# Patient Record
Sex: Female | Born: 1985
Health system: Southern US, Community
[De-identification: ages and names within clinical notes are randomized; demographics above are authoritative.]

## PROBLEM LIST (undated history)

## (undated) DIAGNOSIS — J45909 Unspecified asthma, uncomplicated: Secondary | ICD-10-CM

---
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
## 2005-06-22 ENCOUNTER — Ambulatory Visit: Payer: Self-pay | Admitting: Nurse Practitioner

## 2005-08-05 ENCOUNTER — Ambulatory Visit: Payer: Self-pay | Admitting: Nurse Practitioner

## 2008-05-15 ENCOUNTER — Emergency Department (HOSPITAL_COMMUNITY): Admission: EM | Admit: 2008-05-15 | Discharge: 2008-05-15 | Payer: Self-pay | Admitting: Family Medicine

## 2008-06-30 ENCOUNTER — Inpatient Hospital Stay (HOSPITAL_COMMUNITY): Admission: AD | Admit: 2008-06-30 | Discharge: 2008-06-30 | Payer: Self-pay | Admitting: Obstetrics & Gynecology

## 2008-07-03 ENCOUNTER — Emergency Department (HOSPITAL_COMMUNITY): Admission: EM | Admit: 2008-07-03 | Discharge: 2008-07-03 | Payer: Self-pay | Admitting: Emergency Medicine

## 2008-08-29 ENCOUNTER — Inpatient Hospital Stay (HOSPITAL_COMMUNITY): Admission: RE | Admit: 2008-08-29 | Discharge: 2008-09-05 | Payer: Self-pay | Admitting: Obstetrics & Gynecology

## 2008-08-29 ENCOUNTER — Ambulatory Visit: Payer: Self-pay | Admitting: Obstetrics & Gynecology

## 2008-08-29 ENCOUNTER — Ambulatory Visit: Payer: Self-pay | Admitting: Obstetrics and Gynecology

## 2008-09-02 ENCOUNTER — Encounter: Payer: Self-pay | Admitting: Obstetrics and Gynecology

## 2010-04-08 IMAGING — US US OB DETAIL+14 WK
1 series · 14 of 28 positions shown · non-contrast
Comparison: none

OBSTETRICAL ULTRASOUND:
 This ultrasound exam was performed in the [HOSPITAL] Ultrasound Department.  The OB US report was generated in the AS system, and faxed to the ordering physician.  This report is also available in [REDACTED] PACS.

[Series 1: us ob comp +14 wk · 14 of 65 slices shown]
[im 3/65]
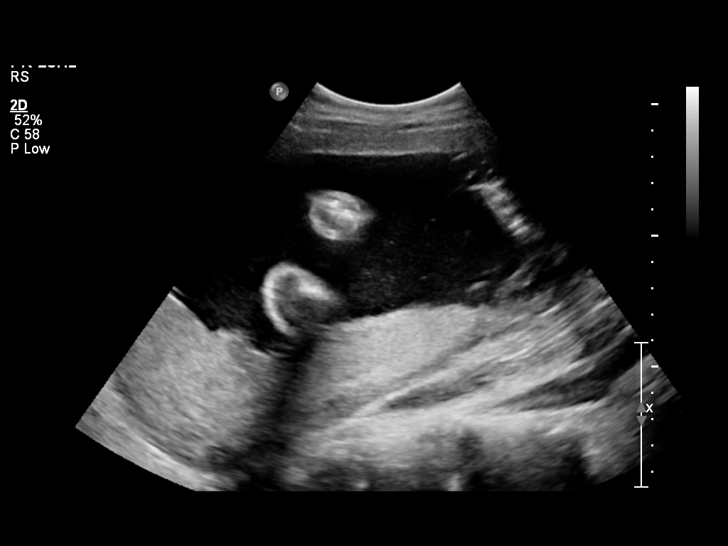
[im 8/65]
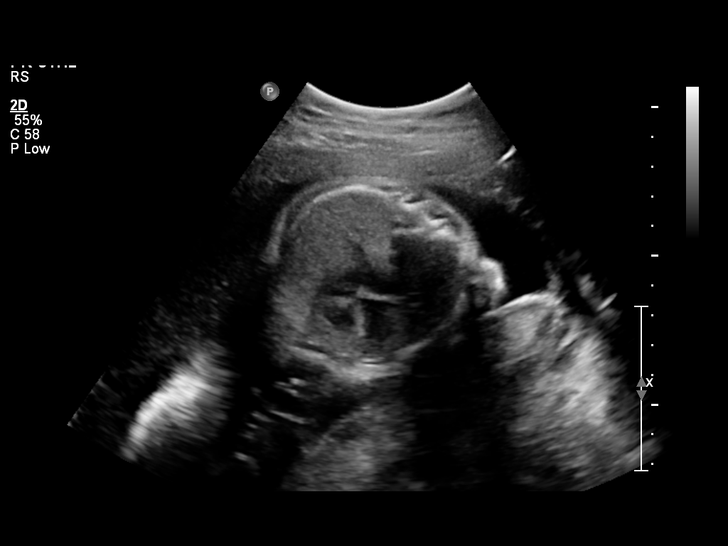
[im 12/65]
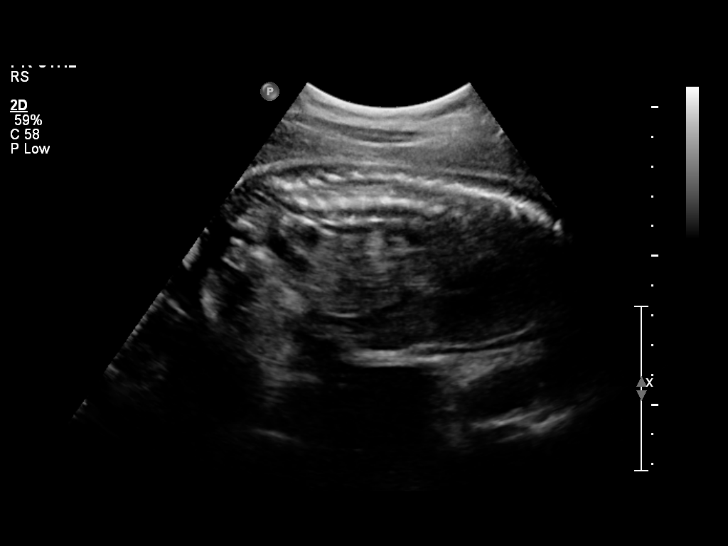
[im 17/65]
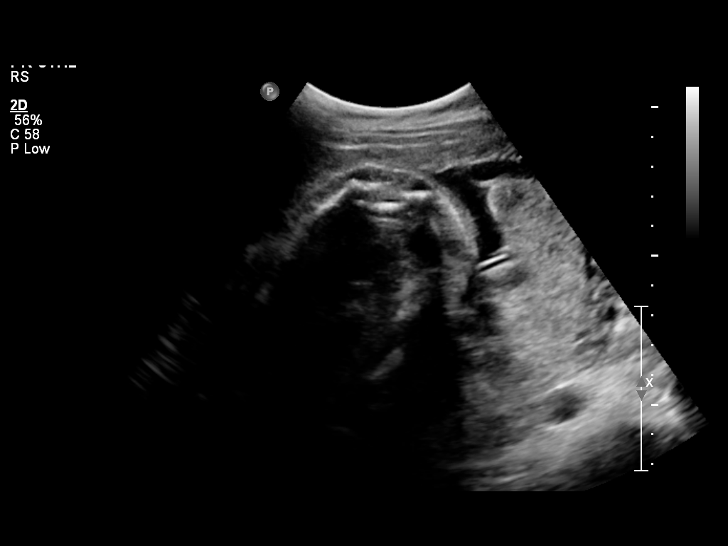
[im 22/65]
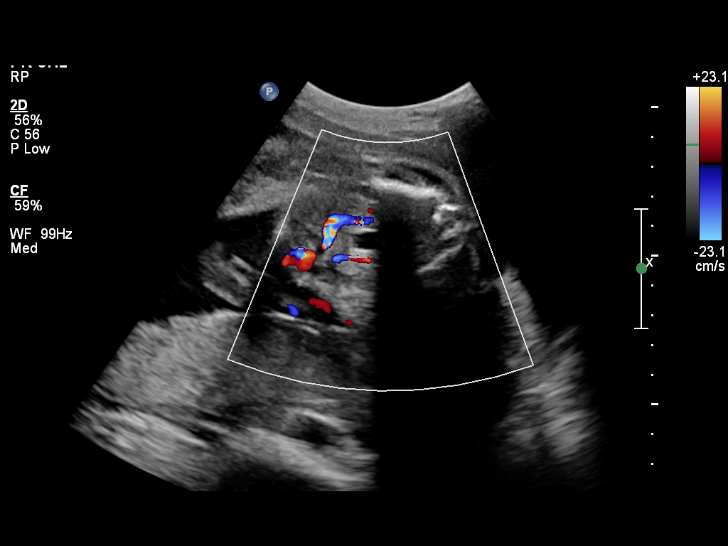
[im 27/65]
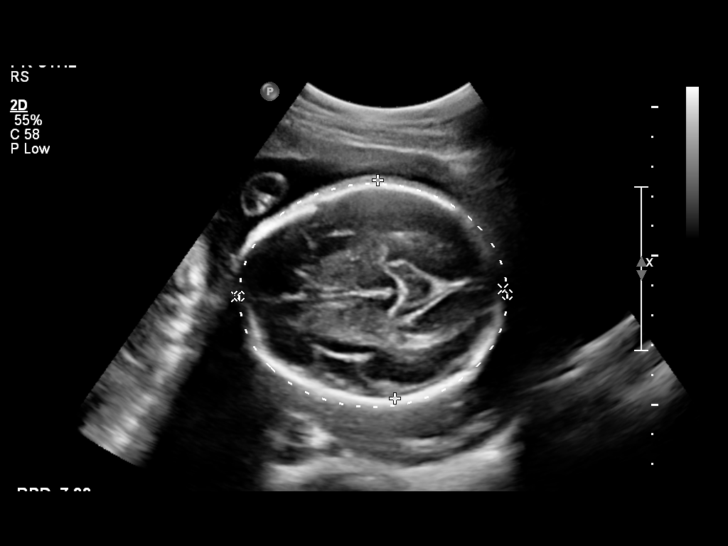
[im 31/65]
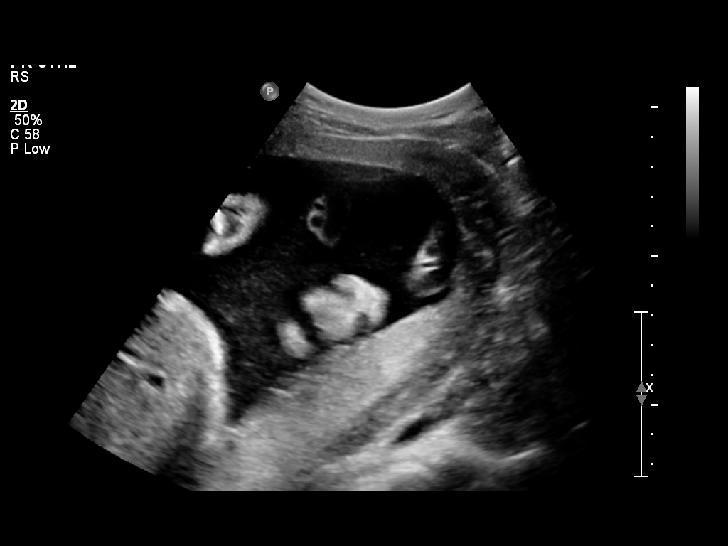
[im 36/65]
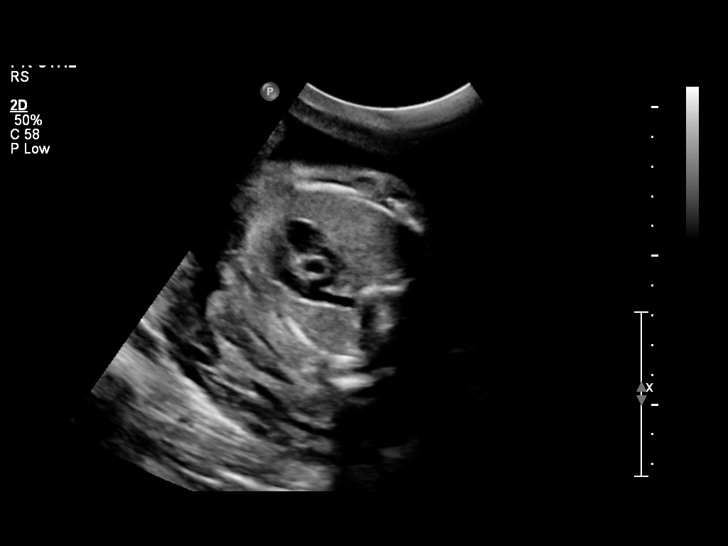
[im 41/65]
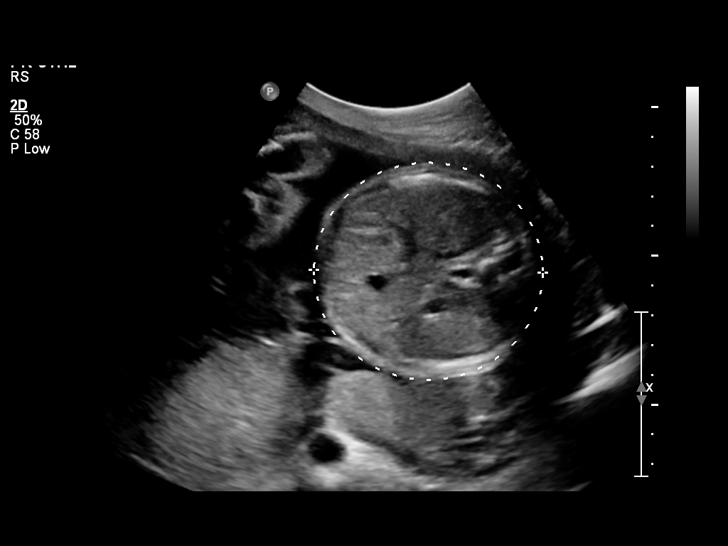
[im 46/65]
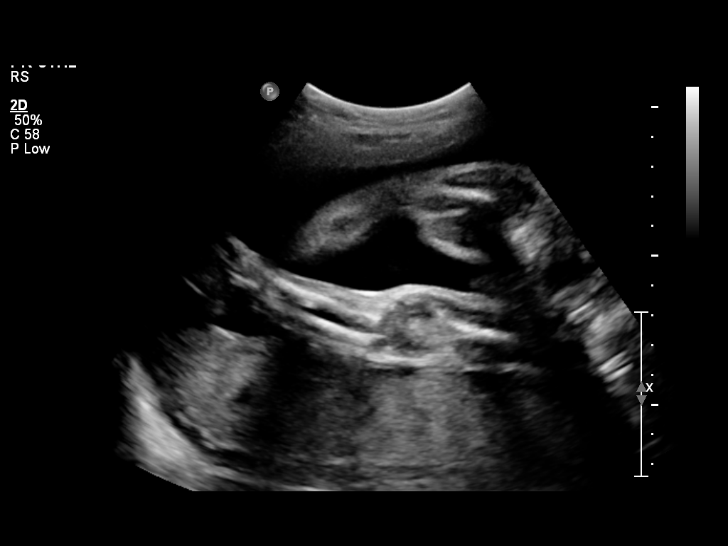
[im 50/65]
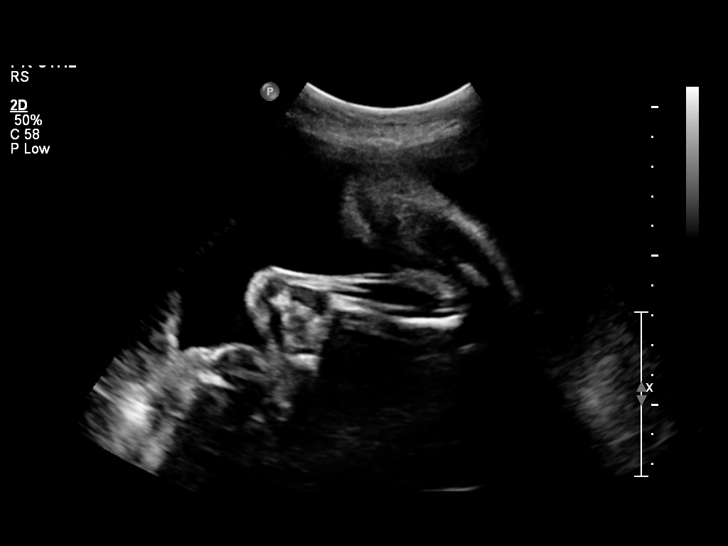
[im 55/65]
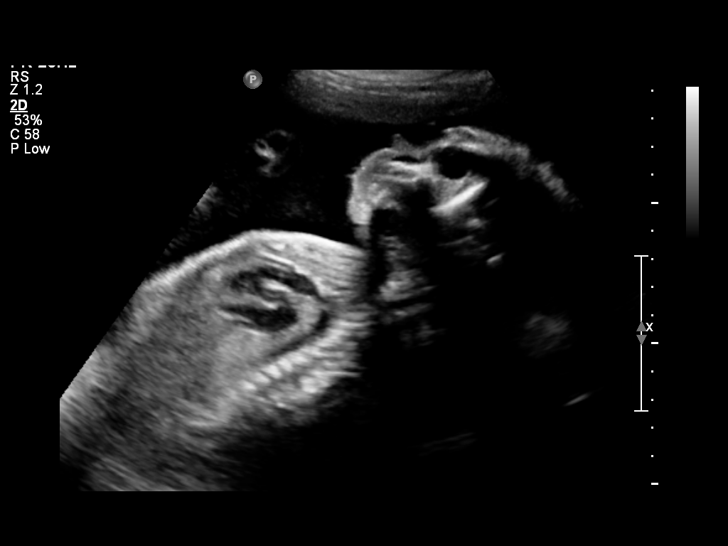
[im 60/65]
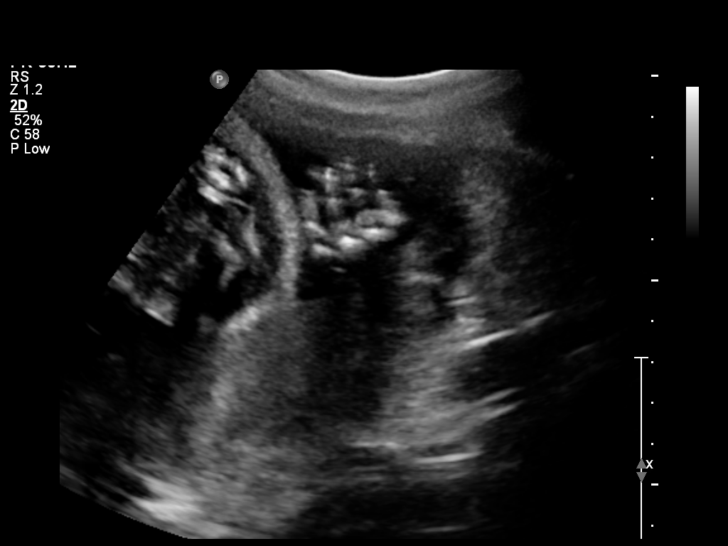
[im 65/65]
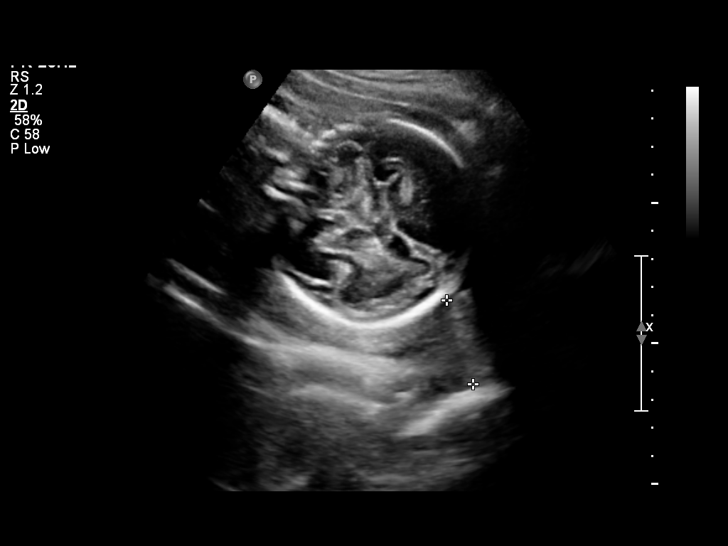

[14 of 28 positions shown; findings below may reference images not displayed]

IMPRESSION: See AS Obstetric US report.

## 2010-07-21 LAB — CBC
HCT: 30.5 % — ABNORMAL LOW (ref 36.0–46.0)
HCT: 37.9 % (ref 36.0–46.0)
Hemoglobin: 12.8 g/dL (ref 12.0–15.0)
MCHC: 34.8 g/dL (ref 30.0–36.0)
MCHC: 35.1 g/dL (ref 30.0–36.0)
MCV: 95.4 fL (ref 78.0–100.0)
MCV: 97.1 fL (ref 78.0–100.0)
Platelets: 183 10*3/uL (ref 150–400)
RBC: 3.81 MIL/uL — ABNORMAL LOW (ref 3.87–5.11)
RBC: 3.97 MIL/uL (ref 3.87–5.11)
RDW: 14 % (ref 11.5–15.5)
WBC: 12.3 10*3/uL — ABNORMAL HIGH (ref 4.0–10.5)
WBC: 8.8 10*3/uL (ref 4.0–10.5)

## 2010-07-21 LAB — COMPREHENSIVE METABOLIC PANEL
ALT: 22 U/L (ref 0–35)
AST: 29 U/L (ref 0–37)
Alkaline Phosphatase: 160 U/L — ABNORMAL HIGH (ref 39–117)
CO2: 25 mEq/L (ref 19–32)
GFR calc Af Amer: >60 mL/min (ref >60)
Glucose, Bld: 83 mg/dL (ref 70–99)
Potassium: 3.1 mEq/L — ABNORMAL LOW (ref 3.5–5.1)
Sodium: 139 mEq/L (ref 135–145)
Total Protein: 6.1 g/dL (ref 6.0–8.3)

## 2010-07-21 LAB — BASIC METABOLIC PANEL
BUN: 4 mg/dL — ABNORMAL LOW (ref 6–23)
Calcium: 7.2 mg/dL — ABNORMAL LOW (ref 8.4–10.5)
Chloride: 102 mEq/L (ref 96–112)
Creatinine, Ser: 0.6 mg/dL (ref 0.4–1.2)
GFR calc Af Amer: >60 mL/min (ref >60)
GFR calc non Af Amer: >60 mL/min (ref >60)

## 2010-07-21 LAB — LACTATE DEHYDROGENASE: LDH: 181 U/L (ref 94–250)

## 2010-07-21 LAB — PROTEIN, URINE, 24 HOUR
Collection Interval-UPROT: 24 hours
Urine Total Volume-UPROT: 1500 mL

## 2010-07-21 LAB — CREATININE CLEARANCE, URINE, 24 HOUR
Creatinine Clearance: 69 mL/min — ABNORMAL LOW (ref 75–115)
Creatinine, 24H Ur: 537 mg/d — ABNORMAL LOW (ref 700–1800)
Creatinine: 0.54 mg/dL (ref 0.40–1.20)

## 2010-07-23 LAB — URINALYSIS, ROUTINE W REFLEX MICROSCOPIC
Ketones, ur: NEGATIVE mg/dL
Nitrite: NEGATIVE
Protein, ur: NEGATIVE mg/dL
Urobilinogen, UA: 0.2 mg/dL (ref 0.0–1.0)

## 2010-07-23 LAB — TYPE AND SCREEN
ABO/RH(D): O POS
Antibody Screen: POSITIVE

## 2010-07-23 LAB — COMPREHENSIVE METABOLIC PANEL
AST: 30 U/L (ref 0–37)
CO2: 24 mEq/L (ref 19–32)
Calcium: 9 mg/dL (ref 8.4–10.5)
Creatinine, Ser: 0.48 mg/dL (ref 0.4–1.2)
GFR calc Af Amer: >60 mL/min (ref >60)
GFR calc non Af Amer: >60 mL/min (ref >60)
Sodium: 135 mEq/L (ref 135–145)
Total Protein: 6.2 g/dL (ref 6.0–8.3)

## 2010-07-23 LAB — URIC ACID: Uric Acid, Serum: 3.5 mg/dL (ref 2.4–7.0)

## 2010-07-23 LAB — DIFFERENTIAL
Eosinophils Relative: 1 % (ref 0–5)
Lymphocytes Relative: 24 % (ref 12–46)
Lymphs Abs: 1.6 10*3/uL (ref 0.7–4.0)
Monocytes Relative: 10 % (ref 3–12)
Neutrophils Relative %: 66 % (ref 43–77)

## 2010-07-23 LAB — CBC
MCHC: 34 g/dL (ref 30.0–36.0)
MCV: 96.5 fL (ref 78.0–100.0)
RBC: 3.62 MIL/uL — ABNORMAL LOW (ref 3.87–5.11)
RDW: 13.6 % (ref 11.5–15.5)

## 2010-07-23 LAB — RPR: RPR Ser Ql: NONREACTIVE

## 2010-07-23 LAB — HEPATITIS B SURFACE ANTIGEN: Hepatitis B Surface Ag: NEGATIVE

## 2010-07-28 LAB — POCT URINALYSIS DIP (DEVICE)
Protein, ur: 30 mg/dL — AB
Urobilinogen, UA: 2 mg/dL — ABNORMAL HIGH (ref 0.0–1.0)

## 2010-08-25 NOTE — Op Note (Signed)
NAME:  [name], [name] NO.:  [id number]   MEDICAL RECORD NO.:  [id number]         PATIENT TYPE:  WINP   LOCATION:                                FACILITY:  [facility]   PHYSICIAN:  Tilda Burrow, M.D. DATE OF BIRTH:  08/28/1985   DATE OF PROCEDURE:  DATE OF DISCHARGE:                               OPERATIVE REPORT   PREOPERATIVE DIAGNOSIS:  Pregnancy 37 weeks 1 day, severe preeclampsia,  nonreassuring fetal heart tones.   POSTOPERATIVE DIAGNOSIS:  Pregnancy 37 weeks 1 day, severe reeclampsia,  nonreassuring fetal heart tones secondary to nuchal cord x1.   PROCEDURE:  Primary low transverse cervical cesarean section.   SURGEON:  Tilda Burrow, MD.   ASSISTANT:  None.   ANESTHESIA:  Epidural.   COMPLICATIONS:  None.   FINDINGS:  5 pounds 13 ounces female infant, Apgars 6 and 8, pH 7.1 7,  placenta sent for pathology, findings nuchal cord x1 very moulded vertex  with recent rapid descent.   INDICATIONS:  A 25 year old female with induction of labor due to  preeclampsia with blood pressures in the 155-160/100 diastolic range.  The patient had presented breech, had successful external versions on  the morning of 08/31/2008 without decelerations and was begun induction  afterwards.  Cervix was unfavorable, requiring Cytotec dosing x6.  The  patient was unable to tolerate it.  Placed on a Foley bulb.  She began  to progress in the afternoon on 05/23, was 3 cm at 1925,  3 cm, 75% to  80%, -1 at 2230.  She was 4 cm at 2:30 a.m.  She began to make rapid  progress with presenting 45, 80+1 at 4:50 a.m., descending rapidly and  dramatically but accompanied by severe bradycardia in the 70s with  intermittent recovery necessitating intervention by cesarean.   DETAILS OF THE PROCEDURE:  The patient was taken to the operating room,  prepped and draped for lower abdominal surgery.  Fetal heart rate was  130 upon arrival to the ward, but promptly returned into the 70s.   Since  the epidural was effective, a transverse lower abdominal uterine  incision was performed.  Time-out had been conducted and agreed to  perform an IV antibiotics administrated.  Pfannenstiel-type incision was  performed with a transverse uterine incision performed and clear  amniotic fluid without malodour and care on nuchal cord x1, barged  immediately for the incision.  The fetal vertex was rotated from its  left occiput transverse position into the incision and the baby expelled  easily with fundal pressure.  Cord was clamped and the baby placed in  the care of Dr. Francine Graven who was in attendance.  Cord blood gas was  obtained, pH returned 7.17.  Cord was sent.  Routine cord samples  obtained and placenta delivered by Crede massage.  The uterus was closed  with running, locking, single first layer followed by an oversewing  second layer of continuous running 0 chromic.  In addition, a figure-of-  eight suture was required on the anterior right side of the uterine  incision.  Hemostasis was obtained as adequate.  A  2-0 chromic bladder  flap closure was followed.  Inspection for continued oozing revealed  subfascial blood vessel that responded to point cautery, completing  adequate hemostasis.  Blood was removed from the abdomen, the anterior  peritoneum closed with 2-0 chromic.  The fascia closed with running 0  Vicryl, subcu tissues reapproximated in the midline x1 with chromic  suture, and then subcuticular 3-0 Vicryl closure of the skin completed.  Steri-Strips were applied and fresh dressing placed.  The patient went  to recovery room in stable condition with blood pressures upon arrival  in recovery room of 117/68 with pulse in the 70s, EBL is 600 mL.  Fluids  received intraop 800, urine 200 mL clear.      Tilda Burrow, M.D.  Electronically Signed     JVF/MEDQ  D:  09/02/2008  T:  09/03/2008  Job:  [id number]

## 2010-08-25 NOTE — Op Note (Signed)
[name], [name]            ACCOUNT NO.:  [id number]   MEDICAL RECORD NO.:  [id number]           PATIENT TYPE:   LOCATION:                                 FACILITY:   PHYSICIAN:  Norton Blizzard, MD    DATE OF BIRTH:  12-14-1985   DATE OF PROCEDURE:  09/03/2008  DATE OF DISCHARGE:                               OPERATIVE REPORT   PREOPERATIVE DIAGNOSIS:  Undesired fertility.   POSTOPERATIVE DIAGNOSIS:  Undesired fertility.   PROCEDURE:  Postpartum bilateral tubal sterilization procedure with  Filshie clips.   SURGEON:  Norton Blizzard, MD   ASSISTANT:  Odie Sera, DO   ANESTHESIA:  General and local.   INDICATIONS FOR PROCEDURE:  Ms. [name] is a 25 year old female  who is status post delivery of an infant 1 day ago.  She has previously  requested and been counseled for an elective tubal sterilization with  the risks to include but not limited to bleeding, infection, and damage  to internal organs as well as the failure rate being approximately 1-2  per 200 with an increased risk of ectopic gestation should failure  occur.  The patient and her mother voiced understanding of these risks  and desired to proceed with the surgery.   PROCEDURE DETAILS:  The patient was taken to the operating room where  general anesthesia was introduced.  She was then prepped and draped in  the usual sterile manner.  A time-out was conducted.  A 2-cm transverse  incision was made in the skin at the inferior aspect of the umbilicus,  extended through subcutaneous layers down to the fascia.  The fascia was  then incised in the midline with Mayo scissors, and the fascial incision  was extended laterally.  The peritoneum was entered bluntly.  The left  fallopian tube was identified and grasped with a Babcock clamp and  traced distally to the fimbriated end.  It was then traced proximally,  and a Filshie clip was placed approximately 2 cm from the cornua of the  uterus.  A 0.3 mL of  0.25% Marcaine were injected into the fallopian  tube.  The tube was then returned to the abdomen.  The right fallopian  tube was identified and grasped with a Babcock clamp.  It was then  traced distally to identify the fimbriated end and traced proximally to  approximately 2 cm from the cornu of the uterus where an another Filshie  clip was placed.  The tube was then injected with 0.3 mL of 0.25%  Marcaine.  The tube was then returned to the abdomen.  The fascia was  then closed using 0 Vicryl in a running noninterlocking manner.  The  skin was closed using 4-0 Vicryl in a subcuticular manner.  The incision  was then injected with 10 mL of 0.25% Marcaine.  All sponge, needle, and  instrument counts were correct x2.   FINDINGS:  Bilateral tubes and ovaries grossly normal.   SPECIMENS:  None.   ESTIMATED BLOOD LOSS:  Minimal.   There were no immediate complications, and the patient was taken to the  PACU in good condition.      Odie Sera, DO  Electronically Signed     ______________________________  Norton Blizzard, MD    MC/MEDQ  D:  09/03/2008  T:  09/04/2008  Job:  [id number]

## 2010-08-25 NOTE — Op Note (Signed)
NAMEONEKA, PARADA            ACCOUNT NO.:  [id number]   MEDICAL RECORD NO.:  [id number]          PATIENT TYPE:  INP   LOCATION:  9103                          FACILITY:  [facility]   PHYSICIAN:  Tilda Burrow, M.D. DATE OF BIRTH:  [birth year]   DATE OF PROCEDURE:  DATE OF DISCHARGE:  09/05/2008                               OPERATIVE REPORT   ADMITTING DIAGNOSES:  1. Pregnancy at [redacted] weeks gestation.  2. Gestational hypertension.  3. Breech presentation.  4. Desire for elective permanent sterilization.   DISCHARGE DIAGNOSES:  1. Pregnancy at 37 weeks, delivered.  2. Gestational hypertension.  3. Fetal distress.  4. Elective sterilization.   PROCEDURE:  1. May 21, external cephalic version, Jannifer Franklin.  2. Aug 30, 2008, decided to take cervical ripening and Pitocin      induction of labor.  3. Sep 02, 2008, emergency primary low-transverse cervical cesarean      section.  4. Sep 03, 2008, postpartum tubal sterilization, Filshie clips, Vela Prose      Duru.  5. Magnesium sulfate prophylaxis, that will be May 22 to Sep 03, 2008.   DISCHARGE MEDICATIONS:  1. Labetalol 200 mg p.o. b.i.d. x2 weeks.  2. HCTZ 25 mg p.o. daily x4 weeks.  3. K-Dur 20 mEq 1 p.o. daily x4 weeks.  4. Colace 100 mg b.i.d. x4 weeks for constipation, use p.r.n.  5. Iron sulfate 325 mg 1 p.o. b.i.d. x30 days for iron replacement.   HOSPITAL SUMMARY:  This 25 year old gravida 1, para 0, mentally  handicapped, mentally limited female was admitted for elevated blood  pressures on Aug 29, 2008.  She had normal liver function test and PH  workup labs were normal.  Her blood pressures were elevated in the  150/90 with occasional blood pressure into the severe gestational  hypertension range x1.  External cephalic version was performed on the  first hospital day.  At that point, she was showing persistently  elevated blood pressures in 150/90 range which showed 2 beats clonus.  Induction was initiated with  Cytotec cervical ripening which was  continued for 30 hours and then Pitocin induction of labor initiated  with the patient gradually progressing.  On the morning of Sep 03, 2008,  an emergency cesarean section was necessary due to dramatic change in  fetal status, still persistent bradycardia into 80s.  Emergency cesarean  section was performed.  Previously requested tubal sterilization was not  addressed at the time of emergency cesarean section due to inadvertent  failure to address this issue.  The baby subsequently did well, a  healthy female infant with pH at 7.16 noted at the time of delivery with  Apgars of 6 and 8.   The patient received 24 hours of magnesium sulfate prophylaxis.  The  following morning, postpartum bilateral tubal sterilization was  performed through a subumbilical 2 cm incision.  She subsequently did  well.  Blood pressure remained elevated and required HCTZ and labetalol  postpartum.  Her postpartum hemoglobin returned 10.7, hematocrit 30.5.  She remained afebrile.  Social work evaluated family status and with her  good support system,  she was considered a reasonable candidate to care  for the baby.  She decided to bottle feed the baby and was discharged on  Sep 05, 2008, tolerating regular diet with blood pressures persistently  elevated in the 150s systolic, 90s diastolic, with occasional slightly  increased blood pressures when the patient is anxious or involved with  health care workers, we think she is not familiar.  She was thus stable  for discharge with Sterile-Strips in place.  Follow up in 4 weeks  [facility].  Baby is scheduled for circumcision  and postpartum social work assessment is ongoing.      Tilda Burrow, M.D.  Electronically Signed     JVF/MEDQ  D:  09/05/2008  T:  09/06/2008  Job:  [id number]   cc:   [facility] Department

## 2010-08-28 NOTE — Discharge Summary (Signed)
[name], [name] NO.:  [id number]   MEDICAL RECORD NO.:  [id number]        PATIENT TYPE:  WINP   LOCATION:                                FACILITY:  [facility]   PHYSICIAN:  Tilda Burrow, M.D. DATE OF BIRTH:  10-09-1985   DATE OF ADMISSION:  08/29/2008  DATE OF DISCHARGE:                               DISCHARGE SUMMARY   ADMITTING DIAGNOSES:  Pregnancy [redacted] weeks gestation, gestational  hypertension, rule out preeclampsia, breech presentation, maternal  mental retardation.   DISCHARGE DIAGNOSES:  Pregnancy 37+ weeks delivered, severe gestational  hypertension/preeclampsia, breech presentation, corrected, desire for  elective permanent sterilization, mental retardation, nonreassuring  fetal status.  Procedures Aug 31, 2008.   PROCEDURE:  External cephalic version from breech to vertex, Tilda Burrow, MD; Cytotec cervical ripening, Tilda Burrow, MD, Aug 31, 2008; Sep 02, 2008, epidural catheter placement, Dr. Leilani Able;  also on Sep 02, 2008, emergency cesarean section for nonreassuring fetal  heart tones, Tilda Burrow, MD; Sep 03, 2008, postpartum tubal  ligation, Dr. Johnella Moloney.   DISCHARGE MEDICATIONS:  1. __________ 5 mg q.4h. p.o. p.r.n. pain.  2. Iron 10 tablets 2 times daily x30 days for anemia.  3. Colace 1 tablet twice daily for constipation.  4. Labetalol 200 mg twice daily x2 weeks for blood pressure elevation.  5. HCTZ 25 mg p.o. q.a.m. x30 days for blood pressure control.  6. K-Dur 20 mEq p.o. daily x30 days.  7. Motrin 600 mg p.o. daily as needed for pain.   HOSPITAL SUMMARY:  This 25 year old gravida 1, para 0 at 37.0 weeks'  gestation was admitted on Aug 29, 2008, for assessment and management of  hypertension.  She was seen in [facility], where she had been  followed for pregnancy care, was found to have elevated blood pressures  to 151/99 with blood type O+, hemoglobin 13, platelets 216,000, RPR  negative.   Blood pressure elevation was 150-160/90-100 with reactive NST  on __________.  She was placed on magnesium sulfate initially with 24-  hour urine collection to be performed.  Magnesium sulfate was  discontinued on the morning Aug 30, 2008, at which time breech  presentation was noted.  The patient was initially undecided on external  cephalic version versus heading to cesarean section.  The patient had  blood pressures from 129/85-152/98 during the observation time.  On the  morning of Aug 31, 2008, external cephalic version was performed as per  patient and family's informed consent.  At this time, she had moderately  elevated blood pressures, but due to the presence of at least 3+  reflexes were reproducible, 2 beats of clonus, it was felt that she  should be treated as a severe preeclamptic and induced.  She was then  begun on Cytotec for cervical ripening.  Magnesium sulfate was added to  management plan.  Blood pressures ranged from 150-155/80-110 diastolic  during induction.  She progressed slowly through 3 Cytotec doses.  On  Sep 01, 2008, she made slow progress to 3 cm 80% -1.  On May 24, she was  4 cm at 2 a.m. and progressed nicely through the morning hours reaching  5-6 cm, 100 percent effaced +1-+2 during rapid descent at approximately  5:00 a.m., but due to severely depressed fetal bradycardia that would  not recover with position changes and discontinuation of Pitocin.  The  patient was taken to cesarean section for nonreassuring fetal status.  A  healthy female infant, pH of 7.17 was delivered.  EBL at surgery was 600  mL.  The patient was managed on magnesium sulfate in the recovery time.   Due to the urgent nature of the cesarean section, tubal sterilization  was not performed.  Subsequent   Dictation ended at this point.      Tilda Burrow, M.D.  Electronically Signed     JVF/MEDQ  D:  09/20/2008  T:  09/21/2008  Job:  [id number]

## 2011-08-01 ENCOUNTER — Encounter (HOSPITAL_COMMUNITY): Payer: Self-pay | Admitting: *Deleted

## 2011-08-01 ENCOUNTER — Emergency Department (HOSPITAL_COMMUNITY)
Admission: EM | Admit: 2011-08-01 | Discharge: 2011-08-01 | Disposition: A | Discharge status: Home / Self Care | Payer: PRIVATE HEALTH INSURANCE | Attending: Emergency Medicine | Admitting: Emergency Medicine

## 2011-08-01 DIAGNOSIS — B353 Tinea pedis: Secondary | ICD-10-CM

## 2011-08-01 DIAGNOSIS — L299 Pruritus, unspecified: Secondary | ICD-10-CM | POA: Insufficient documentation

## 2011-08-01 DIAGNOSIS — M79609 Pain in unspecified limb: Secondary | ICD-10-CM | POA: Insufficient documentation

## 2011-08-01 MED ORDER — CLOTRIMAZOLE 1 % EX CREA: TOPICAL_CREAM | CUTANEOUS | Status: DC

## 2011-08-01 NOTE — ED Provider Notes (Signed)
History     CSN: [id number]  Arrival date & time 08/01/11  1343   First MD Initiated Contact with Patient 08/01/11 1418      Chief Complaint  Patient presents with  . Foot Pain    (Consider location/radiation/quality/duration/timing/severity/associated sxs/prior treatment) Patient is a 26 y.o. female presenting with lower extremity pain. The history is provided by the patient.  Foot Pain This is a new (Patient has noticed worsening rash on her feet with itching and exudate present) problem. Episode onset: 3 weeks ago. The problem occurs constantly. The problem has been gradually worsening. Associated symptoms comments: Drainage, redness or fever. No swelling. The symptoms are aggravated by nothing. The symptoms are relieved by nothing. She has tried nothing for the symptoms. The treatment provided no relief.    History reviewed. No pertinent past medical history.  History reviewed. No pertinent past surgical history.  History reviewed. No pertinent family history.  History  Substance Use Topics  . Smoking status: Not on file  . Smokeless tobacco: Not on file  . Alcohol Use: No    OB History    Grav Para Term Preterm Abortions TAB SAB Ect Mult Living                  Review of Systems  All other systems reviewed and are negative.    Allergies  Review of patient's allergies indicates no known allergies.  Home Medications  No current outpatient prescriptions on file.  BP 124/95  Pulse 71  Temp(Src) 98 F (36.7 C) (Oral)  Resp 18  SpO2 100%  LMP 07/11/2011  Physical Exam  Nursing note and vitals reviewed. Constitutional: She is oriented to person, place, and time. She appears well-developed and well-nourished. No distress.  HENT:  Head: Normocephalic and atraumatic.  Eyes: EOM are normal. Pupils are equal, round, and reactive to light.  Neurological: She is alert and oriented to person, place, and time.  Skin: Skin is warm and dry.     Psychiatric: She  has a normal mood and affect. Her behavior is normal.    ED Course  Procedures (including critical care time)  Labs Reviewed - No data to display No results found.   No diagnosis found.    MDM   Patient with evidence of athlete's foot without underlying secondary bacterial infection. Patient prescribed clotrimazole.        Gwyneth Sprout, MD 08/01/11 1435

## 2011-08-01 NOTE — ED Notes (Signed)
Reports having bilateral foot rash, thinks its athlete foot. No distress noted at triage.

## 2011-08-01 NOTE — Discharge Instructions (Signed)
Athlete's Foot Your exam shows you have athlete's foot, a fungus infection that usually starts between the toes. This condition is made worse by heat, moisture, and friction. To treat it you should wash your feet 2-3 times daily, drying well especially between the toes. Wear cotton socks to absorb sweat and change them twice a day if possible. You should allow your feet to be open to the air as much as possible and wear sandals when possible. The first step in treatment is prevention:  Do not share towels.   Wear sandals or other foot protection in wet areas such as locker rooms, shared showers and public swimming pools.   Keep your feet dry. Wear shoes that allow air to circulate and use cotton or wool socks.  Many products are available to treat athlete's foot. Most are medications that kill fungus and are available as powders or creams. Some require a prescription, some do not. Your caregiver or pharmacist can make a suggestion. Do not use steroid creams on athlete's foot. SEEK MEDICAL CARE IF:   You develop a temperature with no other apparent cause.   You develop swelling and soreness & redness (inflammation) in your foot.   The infection is spreading. Your foot becomes swollen, hot and red (inflamed).   Treatment is not helping.   Athlete's foot is not better in 7 days or completely cured in 30 days.   You have any problems that may be related to the medicine you are taking.  Document Released: 05/06/2004 Document Revised: 12/09/2010 Document Reviewed: 02/11/2009 [employer] Patient Information 2012 [employer]. 

## 2011-08-01 NOTE — ED Notes (Signed)
MD at bedside. 

## 2011-08-09 DIAGNOSIS — W268XXA Contact with other sharp object(s), not elsewhere classified, initial encounter: Secondary | ICD-10-CM | POA: Insufficient documentation

## 2011-08-09 DIAGNOSIS — S91109A Unspecified open wound of unspecified toe(s) without damage to nail, initial encounter: Secondary | ICD-10-CM | POA: Insufficient documentation

## 2011-08-09 NOTE — ED Notes (Signed)
Per EMS pt jumped off her bed and went to get back on the bed and something got stuck in her left great toe  Pt's boyfriend removed the piece of metal and placed it in a bag and brought it with them  Pt is rating pain 10/10  Bandaid applied to pt by EMS

## 2011-08-10 ENCOUNTER — Encounter (HOSPITAL_COMMUNITY): Payer: Self-pay | Admitting: Emergency Medicine

## 2011-08-10 ENCOUNTER — Emergency Department (HOSPITAL_COMMUNITY)
Admission: EM | Admit: 2011-08-10 | Discharge: 2011-08-10 | Disposition: A | Discharge status: Home / Self Care | Payer: PRIVATE HEALTH INSURANCE | Attending: Emergency Medicine | Admitting: Emergency Medicine

## 2011-08-10 DIAGNOSIS — S91139A Puncture wound without foreign body of unspecified toe(s) without damage to nail, initial encounter: Secondary | ICD-10-CM

## 2011-08-10 MED ORDER — TETANUS-DIPHTH-ACELL PERTUSSIS 5-2.5-18.5 LF-MCG/0.5 IM SUSP
0.5000 mL | Freq: Once | INTRAMUSCULAR | Status: AC
  Administered 2011-08-10: 0.5 mL via INTRAMUSCULAR
  Filled 2011-08-10: qty 0.5

## 2011-08-10 NOTE — ED Provider Notes (Signed)
History     CSN: [id number]  Arrival date & time 08/09/11  2356   First MD Initiated Contact with Patient 08/10/11 0017      Chief Complaint  Patient presents with  . Foot Injury    (Consider location/radiation/quality/duration/timing/severity/associated sxs/prior treatment) HPI  Pt presents to the ED by EMS with complaints of stepping on a piece of metal. She was getting on her new bed when a piece of the box spring poked out and stuck her left great toe. It did not stay in her toe but her fiance broke off the piece of metal to bring in and show [place]. The patients pain is in her left great toe 10/10 initially in the waiting room but she informs me that it no longer hurts. EMS applied bandAid. Pain does not refer anywhere. Pt is not a diabetic. Pt does not know her tetanus status.  History reviewed. No pertinent past medical history.  History reviewed. No pertinent past surgical history.  Family History  Problem Relation Age of Onset  . Coronary artery disease Father   . Coronary artery disease Other     History  Substance Use Topics  . Smoking status: Never Smoker   . Smokeless tobacco: Not on file  . Alcohol Use: No    OB History    Grav Para Term Preterm Abortions TAB SAB Ect Mult Living                  Review of Systems   HEENT: denies blurry vision or change in hearing PULMONARY: Denies difficulty breathing and SOB CARDIAC: denies chest pain or heart palpitations MUSCULOSKELETAL:  denies being unable to ambulate ABDOMEN AL: denies abdominal pain GU: denies loss of bowel or urinary control NEURO: denies numbness and tingling in extremities   Allergies  Review of patient's allergies indicates no known allergies.  Home Medications   Current Outpatient Rx  Name Route Sig Dispense Refill  . CLOTRIMAZOLE 1 % EX CREA  Apply to affected area 2 times daily 15 g 0    BP 125/90  Pulse 69  Temp(Src) 99 F (37.2 C) (Oral)  Resp 18  SpO2 99%  LMP  07/14/2011  Physical Exam  Nursing note and vitals reviewed. Constitutional: She appears well-developed and well-nourished. No distress.  HENT:  Head: Normocephalic and atraumatic.  Eyes: Pupils are equal, round, and reactive to light.  Neck: Normal range of motion. Neck supple.  Cardiovascular: Normal rate and regular rhythm.   Pulmonary/Chest: Effort normal.  Abdominal: Soft.  Musculoskeletal:       Left foot: She exhibits tenderness (mild tenderness to posterior portion of great toe.). She exhibits normal range of motion, no bony tenderness, no swelling, normal capillary refill, no crepitus and no deformity. Lacerations: small puncture wound noted and no foriegn body palpated.       Feet:  Neurological: She is alert.  Skin: Skin is warm and dry.    ED Course  Procedures (including critical care time)  Labs Reviewed - No data to display No results found.   1. Puncture wound of toe       MDM  Pt given tetanus shot in ED and return to ED precautions. Pt told specifically to be looking for infection.  No neurological deficits. Patient is ambulatory.  Conservative measures such as rest, ice/heat and pain medicine indicated with PCP follow-up if no improvement with conservative management.     Pt has been advised of the symptoms that warrant their return  to the ED. Patient has voiced understanding and has agreed to follow-up with the PCP or specialist.         Dorthula Matas, PA 08/10/11 [phone]

## 2011-08-10 NOTE — ED Provider Notes (Signed)
Medical screening examination/treatment/procedure(s) were performed by non-physician practitioner and as supervising physician I was immediately available for consultation/collaboration.   Lyanne Co, MD 08/10/11 [phone]

## 2011-08-10 NOTE — ED Notes (Signed)
AVW:[id number]<BJ> Expected date:08/09/11<BR> Expected time:<BR> Means of arrival:<BR> Comments:<BR> EMS 130 GC  = foot injury/ambulatory

## 2011-08-10 NOTE — Discharge Instructions (Signed)
Puncture Wound  A puncture wound is an injury that extends through all layers of the skin and into the tissue beneath the skin (subcutaneous tissue). Puncture wounds become infected easily because germs often enter the body and go beneath the skin during the injury. Having a deep wound with a small entrance point makes it difficult for your caregiver to adequately clean the wound. This is especially true if you have stepped on a nail and it has passed through a dirty shoe or other situations where the wound is obviously contaminated.  CAUSES   Many puncture wounds involve glass, nails, splinters, fish hooks, or other objects that enter the skin (foreign bodies). A puncture wound may also be caused by a human bite or animal bite.  DIAGNOSIS   A puncture wound is usually diagnosed by your history and a physical exam. You may need to have an X-ray or an ultrasound to check for any foreign bodies still in the wound.  TREATMENT    Your caregiver will clean the wound as thoroughly as possible. Depending on the location of the wound, a bandage (dressing) may be applied.   Your caregiver might prescribe antibiotic medicines.   You may need a follow-up visit to check on your wound. Follow all instructions as directed by your caregiver.  HOME CARE INSTRUCTIONS    Change your dressing once per day, or as directed by your caregiver. If the dressing sticks, it may be removed by soaking the area in water.   If your caregiver has given you follow-up instructions, it is very important that you return for a follow-up appointment. Not following up as directed could result in a chronic or permanent injury, pain, and disability.   Only take over-the-counter or prescription medicines for pain, discomfort, or fever as directed by your caregiver.   If you are given antibiotics, take them as directed. Finish them even if you start to feel better.  You may need a tetanus shot if:   You cannot remember when you had your last tetanus  shot.   You have never had a tetanus shot.  If you got a tetanus shot, your arm may swell, get red, and feel warm to the touch. This is common and not a problem. If you need a tetanus shot and you choose not to have one, there is a rare chance of getting tetanus. Sickness from tetanus can be serious.  You may need a rabies shot if an animal bite caused your puncture wound.  SEEK MEDICAL CARE IF:    You have redness, swelling, or increasing pain in the wound.   You have red streaks going away from the wound.   You notice a bad smell coming from the wound or dressing.   You have yellowish-white fluid (pus) coming from the wound.   You are treated with an antibiotic for infection, but the infection is not getting better.   You notice something in the wound, such as rubber from your shoe, cloth, or another object.   You have a fever.   You have severe pain.   You have difficulty breathing.   You feel dizzy or faint.   You cannot stop vomiting.   You lose feeling, develop numbness, or cannot move a limb below the wound.   Your symptoms worsen.  MAKE SURE YOU:   Understand these instructions.   Will watch your condition.   Will get help right away if you are not doing well or get worse.    [employer]Laceration Care, Adult A laceration is a cut or lesion that goes through all layers of the skin and into the tissue just beneath the skin. TREATMENT  Some lacerations may not require closure. Some lacerations may not be able to be closed due to an increased risk of infection. It is important to see your caregiver as soon as possible after an injury to minimize the risk of infection and maximize the opportunity for successful closure. If closure is appropriate, pain medicines may be given, if needed. The wound will be cleaned to help prevent infection.  Your caregiver will use stitches (sutures), staples, wound glue (adhesive), or skin adhesive strips to repair the laceration. These tools bring the skin edges together to allow for faster healing and a better cosmetic outcome. However, all wounds will heal with a scar. Once the wound has healed, scarring can be minimized by covering the wound with sunscreen during the day for 1 full year. HOME CARE INSTRUCTIONS  For sutures or staples:  Keep the wound clean and dry.   If you were given a bandage (dressing), you should change it at least once a day. Also, change the dressing if it becomes wet or dirty, or as directed by your caregiver.   Wash the wound with soap and water 2 times a day. Rinse the wound off with water to remove all soap. Pat the wound dry with a clean towel.   After cleaning, apply a thin layer of the antibiotic ointment as recommended by your caregiver. This will help prevent infection and keep the dressing from sticking.   You may shower as usual after the first 24 hours. Do not soak the wound in water until the sutures are removed.   Only take over-the-counter or prescription medicines for pain, discomfort, or fever as directed by your caregiver.   Get your sutures or staples removed as directed by your caregiver.  For skin adhesive strips:  Keep the wound clean and dry.   Do not get the skin adhesive strips wet. You may bathe carefully, using caution to keep the wound dry.   If the wound gets wet, pat it dry with a clean towel.   Skin adhesive strips will fall off on their own. You may trim the strips as the wound heals. Do not remove skin adhesive strips that are still stuck to the wound. They will fall off in time.  For wound adhesive:  You may briefly wet your wound in the shower or bath. Do not soak or scrub the wound. Do not swim. Avoid periods of heavy perspiration until the skin adhesive has fallen off on its own. After showering or bathing, gently pat the wound  dry with a clean towel.   Do not apply liquid medicine, cream medicine, or ointment medicine to your wound while the skin adhesive is in place. This may loosen the film before your wound is healed.   If a dressing is placed over the wound, be careful not to apply tape directly over the skin adhesive. This may cause the adhesive to be pulled off before the wound is healed.   Avoid prolonged exposure to sunlight or tanning lamps while the skin adhesive is in place. Exposure to ultraviolet light in the first year will darken the scar.   The skin adhesive will usually remain in place for 5 to 10 days, then naturally fall off the skin. Do not pick at the adhesive film.  You may need a tetanus shot if:  You cannot remember when you had your last tetanus shot.   You have never had a tetanus shot.  If you get a tetanus shot, your arm may swell, get red, and feel warm to the touch. This is common and not a problem. If you need a tetanus shot and you choose not to have one, there is a rare chance of getting tetanus. Sickness from tetanus can be serious. SEEK MEDICAL CARE IF:   You have redness, swelling, or increasing pain in the wound.   You see a red line that goes away from the wound.   You have yellowish-white fluid (pus) coming from the wound.   You have a fever.   You notice a bad smell coming from the wound or dressing.   Your wound breaks open before or after sutures have been removed.   You notice something coming out of the wound such as wood or glass.   Your wound is on your hand or foot and you cannot move a finger or toe.  SEEK IMMEDIATE MEDICAL CARE IF:   Your pain is not controlled with prescribed medicine.   You have severe swelling around the wound causing pain and numbness or a change in color in your arm, hand, leg, or foot.   Your wound splits open and starts bleeding.   You have worsening numbness, weakness, or loss of function of any joint around or beyond the  wound.   You develop painful lumps near the wound or on the skin anywhere on your body.  MAKE SURE YOU:   Understand these instructions.   Will watch your condition.   Will get help right away if you are not doing well or get worse.  Document Released: 03/29/2005 Document Revised: 03/18/2011 Document Reviewed: 09/22/2010 [facility] Patient Information 2012 [place], [place].

## 2011-11-16 ENCOUNTER — Encounter (HOSPITAL_COMMUNITY): Payer: Self-pay | Admitting: *Deleted

## 2011-11-16 ENCOUNTER — Emergency Department (HOSPITAL_COMMUNITY)
Admission: EM | Admit: 2011-11-16 | Discharge: 2011-11-16 | Disposition: A | Discharge status: Home / Self Care | Payer: PRIVATE HEALTH INSURANCE | Attending: Emergency Medicine | Admitting: Emergency Medicine

## 2011-11-16 DIAGNOSIS — W57XXXA Bitten or stung by nonvenomous insect and other nonvenomous arthropods, initial encounter: Secondary | ICD-10-CM | POA: Insufficient documentation

## 2011-11-16 DIAGNOSIS — S30860A Insect bite (nonvenomous) of lower back and pelvis, initial encounter: Secondary | ICD-10-CM | POA: Insufficient documentation

## 2011-11-16 MED ORDER — DIPHENHYDRAMINE HCL 25 MG PO TABS: 25.0000 mg | ORAL_TABLET | Freq: Four times a day (QID) | ORAL | Status: DC

## 2011-11-16 MED ORDER — FAMOTIDINE 20 MG PO TABS
40.0000 mg | ORAL_TABLET | Freq: Once | ORAL | Status: AC
  Administered 2011-11-16: 40 mg via ORAL
  Filled 2011-11-16: qty 2

## 2011-11-16 NOTE — ED Provider Notes (Signed)
History     CSN: [id number]  Arrival date & time 11/16/11  0033   First MD Initiated Contact with Patient 11/16/11 0309      Chief Complaint  Patient presents with  . Rash    (Consider location/radiation/quality/duration/timing/severity/associated sxs/prior treatment) HPI Comments: Rash along bar strap L sie and a few on hew arms noted tonight + puritis  Patient is a 26 y.o. female presenting with rash. The history is provided by the patient.  Rash  This is a new problem. The problem has not changed since onset.There has been no fever.    History reviewed. No pertinent past medical history.  History reviewed. No pertinent past surgical history.  Family History  Problem Relation Age of Onset  . Coronary artery disease Father   . Coronary artery disease Other     History  Substance Use Topics  . Smoking status: Never Smoker   . Smokeless tobacco: Not on file  . Alcohol Use: No    OB History    Grav Para Term Preterm Abortions TAB SAB Ect Mult Living                  Review of Systems  Constitutional: Negative for fever and chills.  Skin: Positive for rash. Negative for wound.  Neurological: Negative for dizziness and headaches.    Allergies  Review of patient's allergies indicates no known allergies.  Home Medications   Current Outpatient Rx  Name Route Sig Dispense Refill  . DIPHENHYDRAMINE HCL 25 MG PO TABS Oral Take 1 tablet (25 mg total) by mouth every 6 (six) hours. 20 tablet 0    BP 117/85  Pulse 70  Temp 98 F (36.7 C)  Resp 20  SpO2 100%  LMP 11/09/2011  Physical Exam  Constitutional: She appears well-developed and well-nourished.  Eyes: Pupils are equal, round, and reactive to light.  Cardiovascular: Normal rate.   Pulmonary/Chest: Effort normal.  Musculoskeletal: Normal range of motion.  Neurological: She is alert.  Skin: Rash noted.       serveral red raised bumps along bra strap and on arms     ED Course  Procedures (including  critical care time)  Labs Reviewed - No data to display No results found.   1. Insect bites       MDM  Most liely insect bites         Arman Filter, NP 11/16/11 0327  Arman Filter, NP 11/16/11 0327  Arman Filter, NP 11/16/11 [id number]

## 2011-11-16 NOTE — ED Provider Notes (Signed)
Medical screening examination/treatment/procedure(s) were performed by non-physician practitioner and as supervising physician I was immediately available for consultation/collaboration.   Hanley Seamen, MD 11/16/11 [phone]

## 2011-11-16 NOTE — ED Notes (Signed)
Pt c/o rash on back and arms; itching

## 2011-11-16 NOTE — ED Notes (Addendum)
Patient sts she has small insect bites on her whole body, most notably her  back, arms and torso. Small clusters of papules are seen on patient skin. Patient sts she just noticed tonight the bites but patients boyfriend sts that he has been feeling bites on himself for a while when he is at home. Sts they have seen many insects at home but are unsure of the type of bite pt has. Pt has taken no anti itch medication or pain medication.

## 2011-11-16 NOTE — ED Notes (Signed)
Patient given discharge instructions, information, prescriptions, and diet order. Patient states that they adequately understand discharge information given and to return to ED if symptoms return or worsen.     

## 2011-11-25 ENCOUNTER — Encounter (HOSPITAL_COMMUNITY): Payer: Self-pay

## 2011-11-25 ENCOUNTER — Emergency Department (HOSPITAL_COMMUNITY)
Admission: EM | Admit: 2011-11-25 | Discharge: 2011-11-26 | Disposition: A | Discharge status: Home / Self Care | Payer: PRIVATE HEALTH INSURANCE | Attending: Emergency Medicine | Admitting: Emergency Medicine

## 2011-11-25 DIAGNOSIS — S199XXA Unspecified injury of neck, initial encounter: Secondary | ICD-10-CM | POA: Insufficient documentation

## 2011-11-25 DIAGNOSIS — Z8249 Family history of ischemic heart disease and other diseases of the circulatory system: Secondary | ICD-10-CM | POA: Insufficient documentation

## 2011-11-25 DIAGNOSIS — S0993XA Unspecified injury of face, initial encounter: Secondary | ICD-10-CM | POA: Insufficient documentation

## 2011-11-25 DIAGNOSIS — J45909 Unspecified asthma, uncomplicated: Secondary | ICD-10-CM | POA: Insufficient documentation

## 2011-11-25 DIAGNOSIS — IMO0002 Reserved for concepts with insufficient information to code with codable children: Secondary | ICD-10-CM | POA: Insufficient documentation

## 2011-11-25 HISTORY — DX: Unspecified asthma, uncomplicated: J45.909

## 2011-11-25 NOTE — ED Notes (Signed)
FAO:[id number]<MV> Expected date:<BR> Expected time:<BR> Means of arrival:<BR> Comments:<BR> EMS/pt &quot;playing around&quot; fell and hit her throat-no obvious injury

## 2011-11-25 NOTE — ED Notes (Signed)
Pt was playing around with her boyfriend and tripped and hit her throat on a couch. No trauma, bruising, or SOB. NAD.

## 2011-11-26 ENCOUNTER — Emergency Department (HOSPITAL_COMMUNITY): Payer: PRIVATE HEALTH INSURANCE
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
## 2011-11-26 NOTE — ED Notes (Signed)
Patient's boyfriend reports the patient is feeling uncomfortable.  Pt is not verbalizing any pain.

## 2011-11-26 NOTE — ED Provider Notes (Signed)
Medical screening examination/treatment/procedure(s) were performed by non-physician practitioner and as supervising physician I was immediately available for consultation/collaboration.  Sunnie Nielsen, MD 11/26/11 [phone]

## 2013-09-03 IMAGING — CR DG NECK SOFT TISSUE
1 series · 1 of 1 positions shown · non-contrast
Comparison: None.

CLINICAL DATA: Throat pain.  Neck pain.

NECK SOFT TISSUES - 1+ VIEW

[w soft tissue neck lat]
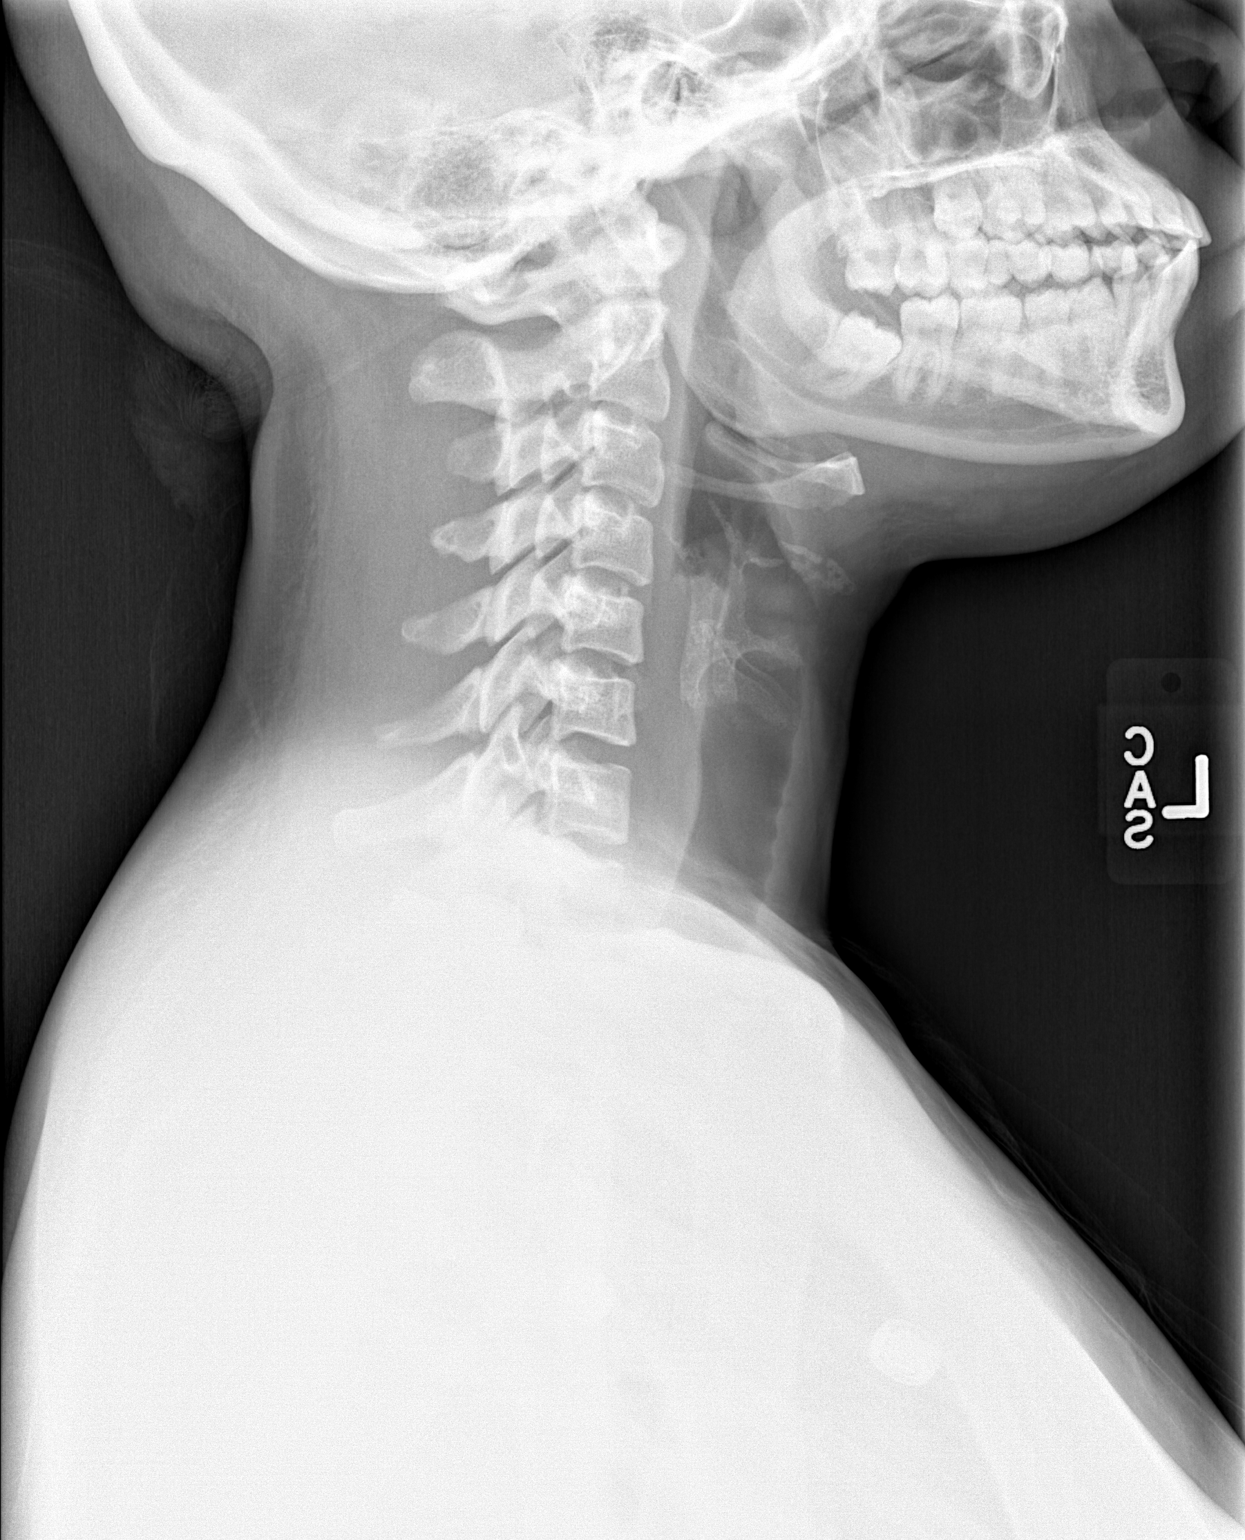

[1 of 1 positions shown; findings below may reference images not displayed]

FINDINGS: Prevertebral soft tissues normal.  Cervical spinal
alignment is anatomic with some mild straightening.  Epiglottis and
aryepiglottic folds appear within normal limits.  Tracheal air
column appears normal.
IMPRESSION: Negative.

## 2016-05-03 ENCOUNTER — Emergency Department (HOSPITAL_COMMUNITY)
Admission: EM | Admit: 2016-05-03 | Discharge: 2016-05-04 | Disposition: A | Discharge status: Home / Self Care | Payer: Medicare Other | Attending: Emergency Medicine | Admitting: Emergency Medicine

## 2016-05-03 ENCOUNTER — Encounter (HOSPITAL_COMMUNITY): Payer: Self-pay | Admitting: Emergency Medicine

## 2016-05-03 DIAGNOSIS — Z79899 Other long term (current) drug therapy: Secondary | ICD-10-CM | POA: Insufficient documentation

## 2016-05-03 DIAGNOSIS — F4323 Adjustment disorder with mixed anxiety and depressed mood: Secondary | ICD-10-CM | POA: Diagnosis not present

## 2016-05-03 DIAGNOSIS — Z8489 Family history of other specified conditions: Secondary | ICD-10-CM | POA: Diagnosis not present

## 2016-05-03 DIAGNOSIS — R45851 Suicidal ideations: Secondary | ICD-10-CM | POA: Diagnosis not present

## 2016-05-03 DIAGNOSIS — F341 Dysthymic disorder: Secondary | ICD-10-CM | POA: Diagnosis present

## 2016-05-03 LAB — RAPID URINE DRUG SCREEN, HOSP PERFORMED
AMPHETAMINES: NOT DETECTED
BARBITURATES: NOT DETECTED
BENZODIAZEPINES: NOT DETECTED
COCAINE: NOT DETECTED
Opiates: NOT DETECTED
Tetrahydrocannabinol: NOT DETECTED

## 2016-05-03 LAB — COMPREHENSIVE METABOLIC PANEL
ALBUMIN: 4.2 g/dL (ref 3.5–5.0)
ALT: 25 U/L (ref 14–54)
AST: 27 U/L (ref 15–41)
Alkaline Phosphatase: 58 U/L (ref 38–126)
Anion gap: 6 (ref 5–15)
BILIRUBIN TOTAL: 0.5 mg/dL (ref 0.3–1.2)
BUN: 10 mg/dL (ref 6–20)
CHLORIDE: 105 mmol/L (ref 101–111)
CO2: 29 mmol/L (ref 22–32)
Calcium: 8.9 mg/dL (ref 8.9–10.3)
Creatinine, Ser: 0.76 mg/dL (ref 0.44–1.00)
GFR calc Af Amer: >60 mL/min (ref >60)
GFR calc non Af Amer: >60 mL/min (ref >60)
GLUCOSE: 72 mg/dL (ref 65–99)
POTASSIUM: 3.8 mmol/L (ref 3.5–5.1)
SODIUM: 140 mmol/L (ref 135–145)
Total Protein: 7.8 g/dL (ref 6.5–8.1)

## 2016-05-03 LAB — CBC
HEMATOCRIT: 39.1 % (ref 36.0–46.0)
HEMOGLOBIN: 13.4 g/dL (ref 12.0–15.0)
MCH: 30.5 pg (ref 26.0–34.0)
MCHC: 34.3 g/dL (ref 30.0–36.0)
MCV: 88.9 fL (ref 78.0–100.0)
Platelets: 254 10*3/uL (ref 150–400)
RBC: 4.4 MIL/uL (ref 3.87–5.11)
RDW: 13 % (ref 11.5–15.5)
WBC: 4.7 10*3/uL (ref 4.0–10.5)

## 2016-05-03 LAB — ACETAMINOPHEN LEVEL: Acetaminophen (Tylenol), Serum: <10 ug/mL — ABNORMAL LOW (ref 10–30)

## 2016-05-03 LAB — SALICYLATE LEVEL: Salicylate Lvl: <7 mg/dL (ref 2.8–30.0)

## 2016-05-03 LAB — ETHANOL: Alcohol, Ethyl (B): <5 mg/dL (ref <5)

## 2016-05-03 MED ORDER — SERTRALINE HCL 50 MG PO TABS: 50.0000 mg | ORAL_TABLET | Freq: Every day | ORAL | Status: DC

## 2016-05-03 NOTE — ED Notes (Signed)
Patient has two bags of belongings in the triage cabinet.

## 2016-05-03 NOTE — Progress Notes (Signed)
ED CM left pt uninsured [place] resources in her locker #27 CM spoke with pt who [place].  CM provided written information to assist pt with determining choice for uninsured accepting pcps, discussed the importance of pcp vs EDP services for f/u care, www.needymeds.org, www.goodrx.com, discounted pharmacies and other [place] resources such as [facility] , [facility], affordable care act, financial assistance, uninsured dental services, [place] med assist, DSS and  health department  Provided resources for [place] uninsured accepting pcps like [facility], [facility] medicine [facility] [address], [facility], [facility], local urgent care centers, [facility], [facility], general medical clinics, [facility] the [place], [facility] plus others, medication resources, [facility] out patient pharmacies and housing Provided [facility] contact information  Pt guardian visiting and referred to [facility] RN to assist with entry of her contact information  Pt asked about "tests" states she is waiting on someone to do her labs Pt very pleasant and cooperative

## 2016-05-03 NOTE — BH Assessment (Signed)
[facility] Assessment Progress Note   Case was staffed with Shaune PollackLord DNP who reccommended patient be re-evaluated in the a.m. Staff will attempt to contact guardian on 05/04/16 to gather additional history and collateral information.

## 2016-05-03 NOTE — ED Notes (Signed)
Pt given lunch tray. Eating but tearful. Denies current SI. Endorses previous thoughts of hurting herself.

## 2016-05-03 NOTE — ED Provider Notes (Signed)
[facility]-EMERGENCY DEPT Provider Note   CSN: [id number] Arrival date & time: 05/03/16  1051     History   Chief Complaint Chief Complaint  Patient presents with  . Suicidal  Level V caveat psychiatric complaint  HPI [name] is a 31 y.o. female.Patient is feeling suicidal over the death of her parents 3 years ago and also feels that she has trouble caring for 33-year-old son. Plan of suicide is to stab herself. She denies other complaint. She has no prior suicide attempt. No other associated symptoms.  HPI  History reviewed. No pertinent past medical history. Past medical history anxiety There are no active problems to display for this patient.   No past surgical history on file.  OB History    No data available       Home Medications    Prior to Admission medications   Medication Sig Start Date End Date Taking? Authorizing Provider  meloxicam (MOBIC) 15 MG tablet Take 15 mg by mouth daily as needed for pain.   Yes Historical Provider, MD  sertraline (ZOLOFT) 50 MG tablet Take 50 mg by mouth daily.   Yes Historical Provider, MD    Family History Family History  Problem Relation Age of Onset  . Coronary artery disease Father   . Coronary artery disease Other     Social History Social History  Substance Use Topics  . Smoking status: Never Smoker  . Smokeless tobacco: Not on file  . Alcohol use No     Allergies   Patient has no known allergies.   Review of Systems Review of Systems  Unable to perform ROS: Psychiatric disorder  Psychiatric/Behavioral: Positive for dysphoric mood.     Physical Exam Updated Vital Signs BP 138/91 (BP Location: Left Arm)   Pulse 75   Temp 98.7 F (37.1 C) (Oral)   Resp 16   LMP 04/30/2016   SpO2 100%   Physical Exam  Constitutional: She is oriented to person, place, and time. She appears well-developed and well-nourished.  HENT:  Head: Normocephalic and atraumatic.  Eyes: Conjunctivae are normal. Pupils  are equal, round, and reactive to light.  Neck: Neck supple. No tracheal deviation present. No thyromegaly present.  Cardiovascular: Normal rate and regular rhythm.   No murmur heard. Pulmonary/Chest: Effort normal and breath sounds normal.  Abdominal: Soft. Bowel sounds are normal. She exhibits no distension. There is no tenderness.  Musculoskeletal: Normal range of motion. She exhibits no edema or tenderness.  Neurological: She is alert and oriented to person, place, and time. Coordination normal.  Speech is easily comprehensible  Skin: Skin is warm and dry. No rash noted.  Psychiatric: She has a normal mood and affect.  Nursing note and vitals reviewed.    ED Treatments / Results  Labs (all labs ordered are listed, but only abnormal results are displayed) Labs Reviewed  RAPID URINE DRUG SCREEN, HOSP PERFORMED  COMPREHENSIVE METABOLIC PANEL  ETHANOL  SALICYLATE LEVEL  ACETAMINOPHEN LEVEL  CBC    EKG  EKG Interpretation None      Results for orders placed or performed during the hospital encounter of 05/03/16  Comprehensive metabolic panel  Result Value Ref Range   Sodium 140 135 - 145 mmol/L   Potassium 3.8 3.5 - 5.1 mmol/L   Chloride 105 101 - 111 mmol/L   CO2 29 22 - 32 mmol/L   Glucose, Bld 72 65 - 99 mg/dL   BUN 10 6 - 20 mg/dL   Creatinine, Ser 8.650.76  0.44 - 1.00 mg/dL   Calcium 8.9 8.9 - 16.1 mg/dL   Total Protein 7.8 6.5 - 8.1 g/dL   Albumin 4.2 3.5 - 5.0 g/dL   AST 27 15 - 41 U/L   ALT 25 14 - 54 U/L   Alkaline Phosphatase 58 38 - 126 U/L   Total Bilirubin 0.5 0.3 - 1.2 mg/dL   GFR calc non Af Amer >60 >60 mL/min   GFR calc Af Amer >60 >60 mL/min   Anion gap 6 5 - 15  Ethanol  Result Value Ref Range   Alcohol, Ethyl (B) <5 <5 mg/dL  Salicylate level  Result Value Ref Range   Salicylate Lvl <7.0 2.8 - 30.0 mg/dL  Acetaminophen level  Result Value Ref Range   Acetaminophen (Tylenol), Serum <10 (L) 10 - 30 ug/mL  cbc  Result Value Ref Range   WBC  4.7 4.0 - 10.5 K/uL   RBC 4.40 3.87 - 5.11 MIL/uL   Hemoglobin 13.4 12.0 - 15.0 g/dL   HCT 09.6 04.5 - 40.9 %   MCV 88.9 78.0 - 100.0 fL   MCH 30.5 26.0 - 34.0 pg   MCHC 34.3 30.0 - 36.0 g/dL   RDW 81.1 91.4 - 78.2 %   Platelets 254 150 - 400 K/uL  Rapid urine drug screen (hospital performed)  Result Value Ref Range   Opiates NONE DETECTED NONE DETECTED   Cocaine NONE DETECTED NONE DETECTED   Benzodiazepines NONE DETECTED NONE DETECTED   Amphetamines NONE DETECTED NONE DETECTED   Tetrahydrocannabinol NONE DETECTED NONE DETECTED   Barbiturates NONE DETECTED NONE DETECTED   No results found. Radiology No results found.  Procedures Procedures (including critical care time)  Medications Ordered in ED Medications - No data to display   Initial Impression / Assessment and Plan / ED Course  I have reviewed the triage vital signs and the nursing notes.  Pertinent labs & imaging results that were available during my care of the patient were reviewed by me and considered in my medical decision making (see chart for details).    Patient is medically clear for psychiatric evaluation TTS consult ordered  Final Clinical Impressions(s) / ED Diagnoses  Diagnosis suicidal ideation Final diagnoses:  None    New Prescriptions New Prescriptions   No medications on file     Doug Sou, MD 05/03/16 1540

## 2016-05-03 NOTE — ED Notes (Signed)
Bed: WA27 Expected date:  Expected time:  Means of arrival:  Comments: Triage 4 

## 2016-05-03 NOTE — ED Triage Notes (Signed)
Pt BIB aunt.  Pt's aunt states that pt has made comments about hurting herself.  States that she doesn't feel that she can care for her 33 year old son.  Pt is very difficult to understand due to speech impediment.  Denies HI.  Denies drugs or alcohol.

## 2016-05-03 NOTE — ED Notes (Signed)
Belongings placed in TCU locker #27 (two bags of belongings)

## 2016-05-03 NOTE — ED Notes (Signed)
Patient's legal guardian and aunt visited. Requested additional information regarding patient's evaluation and duration of stay. Took family members contact info and offered that we could reach out as we had more information.

## 2016-05-03 NOTE — BH Assessment (Addendum)
Assessment Note  [name] is an 31 y.o. female that presents this date with thoughts of self harm per notes. Patient has a speech impediment and is difficult to understand. Patient did nod "yes" when asked if she had thoughts of self harm. This writer attempted to reframe questions although patient did not seem to process the content of this writer's questions. Patient states she is "sad". Patient has a guardian who transported patient to ED [name](Sharon Tolvert [phone]). This writer attempted to contact guardian to gather collateral information but was unsuccessful. Admission notes states: "Pt BIB aunt. Pt's aunt states that pt has made comments about hurting herself.  States that she doesn't feel that she can care for her 33 year old son. Pt is very difficult to understand due to speech impediment. Denies HI. Denies drugs or alcohol". Patient presents with a pleasant affect but is anxious. Patient is oriented to time/place and nods "yes" when this writer names the days of the week and year. Patient renders limited history. Per admission note states: "Patient is feeling suicidal over the death of her parents 3 years ago and also feels that she has trouble caring for 33-year-old son. Plan of suicide is to stab herself. She denies other complaint. She has no prior suicide attempt. No other associated symptoms".Case was staffed with Sharon PollackLord DNP who recommended patient be re-evaluated in the a.m. Staff will attempt to contact guardian on 05/04/16 to gather additional history and collateral information.     Diagnosis: Major depressive D/O without psychotic features, severe   Past Medical History: History reviewed. No pertinent past medical history.  No past surgical history on file.  Family History:  Family History  Problem Relation Age of Onset  . Coronary artery disease Father   . Coronary artery disease Other     Social History:  reports that she has never smoked. She does not have any smokeless  tobacco history on file. She reports that she does not drink alcohol or use drugs.  Additional Social History:  Alcohol / Drug Use Pain Medications: See MAR Prescriptions: See MAR Over the Counter: See MAR History of alcohol / drug use?: No history of alcohol / drug abuse  CIWA: CIWA-Ar BP: 138/91 Pulse Rate: 75 COWS:    Allergies: No Known Allergies  Home Medications:  (Not in a hospital admission)  OB/GYN Status:  Patient's last menstrual period was 04/30/2016.  General Assessment Data Location of Assessment: [facility] TTS Assessment: In system Is this a Tele or Face-to-Face Assessment?: Face-to-Face Is this an Initial Assessment or a Re-assessment for this encounter?: Initial Assessment Marital status: Divorced [place] na Is patient pregnant?: No Pregnancy Status: No Living Arrangements: Other relatives Can pt return to current living arrangement?: Yes Admission Status: Voluntary Is patient capable of signing voluntary admission?: Yes Referral Source: Self/Family/Friend Insurance type: Medicaid  Medical Screening Exam [facility] Walk[facility] ONLY) Medical Exam completed: Yes  Crisis Care Plan Living Arrangements: Other relatives Legal Guardian:  [name]([name]) Name of Psychiatrist: None Name of Therapist: None  Education Status Is patient currently in school?: No Current Grade: na Highest grade of school patient has completed: 12 Name of school: na Contact person: na  Risk to self with the past 6 months Suicidal Ideation: Yes-Currently Present Has patient been a risk to self within the past 6 months prior to admission? : No Suicidal Intent: Yes-Currently Present Has patient had any suicidal intent within the past 6 months prior to admission? : No Is patient at  risk for suicide?: Yes Suicidal Plan?: Yes-Currently Present Has patient had any suicidal plan within the past 6 months prior to admission? : No Specify Current Suicidal Plan: stab herself Access to  Means: No What has been your use of drugs/alcohol within the last 12 months?: Denies Previous Attempts/Gestures: No How many times?: 0 Other Self Harm Risks: na Triggers for Past Attempts: Unknown Intentional Self Injurious Behavior: None Family Suicide History: No Recent stressful life event(s): Other (Comment) Persecutory voices/beliefs?: No Depression: No Depression Symptoms:  (na) Substance abuse history and/or treatment for substance abuse?: No Suicide prevention information given to non-admitted patients: Yes  Risk to Others within the past 6 months Homicidal Ideation: No Does patient have any lifetime risk of violence toward others beyond the six months prior to admission? : No Thoughts of Harm to Others: No Current Homicidal Intent: No Current Homicidal Plan: No Access to Homicidal Means: No Identified Victim: na History of harm to others?: No Assessment of Violence: None Noted Violent Behavior Description: na Does patient have access to weapons?: No Criminal Charges Pending?: No Does patient have a court date: No Is patient on probation?: No  Psychosis Hallucinations: None noted Delusions: None noted  Mental Status Report Appearance/Hygiene: In scrubs Eye Contact: Fair Motor Activity: Freedom of movement Speech: Incoherent Level of Consciousness: Alert Mood: Anxious Affect: Anxious Anxiety Level: Moderate Thought Processes: Relevant Judgement: Partial Orientation: Person, Place, Time Obsessive Compulsive Thoughts/Behaviors: None  Cognitive Functioning Concentration: Decreased Memory: Recent Intact IQ:  (UTA) Insight: Fair Impulse Control: Fair Appetite: Fair Weight Loss: 0 Weight Gain: 0 Sleep: Increased Total Hours of Sleep: 6 Vegetative Symptoms: None  ADLScreening [facility] Assessment Services) Patient's cognitive ability adequate to safely complete daily activities?: Yes Patient able to express need for assistance with ADLs?: Yes Independently  performs ADLs?: Yes (appropriate for developmental age)  Prior Inpatient Therapy Prior Inpatient Therapy: No Prior Therapy Dates: na Prior Therapy Facilty/Provider(s): na Reason for Treatment: na  Prior Outpatient Therapy Prior Outpatient Therapy: No Prior Therapy Dates: na Prior Therapy Facilty/Provider(s): na Reason for Treatment: na Does patient have an ACCT team?: Unknown Does patient have Intensive In-House Services?  : Unknown Does patient have [facility] services? : Yes Does patient have P4CC services?: No  ADL Screening (condition at time of admission) Patient's cognitive ability adequate to safely complete daily activities?: Yes Is the patient deaf or have difficulty hearing?: No Does the patient have difficulty seeing, even when wearing glasses/contacts?: No Does the patient have difficulty concentrating, remembering, or making decisions?: No Patient able to express need for assistance with ADLs?: Yes Does the patient have difficulty dressing or bathing?: No Independently performs ADLs?: Yes (appropriate for developmental age) Does the patient have difficulty walking or climbing stairs?: No Weakness of Legs: None Weakness of Arms/Hands: None  Home Assistive Devices/Equipment Home Assistive Devices/Equipment: None  Therapy Consults (therapy consults require a physician order) PT Evaluation Needed: No OT Evalulation Needed: No SLP Evaluation Needed: No Abuse/Neglect Assessment (Assessment to be complete while patient is alone) Physical Abuse: Denies Verbal Abuse: Denies Sexual Abuse: Denies Exploitation of patient/patient's resources: Denies Self-Neglect: Denies Values / Beliefs Cultural Requests During Hospitalization: None Spiritual Requests During Hospitalization: None Consults Spiritual Care Consult Needed: No Social Work Consult Needed: No Merchant navy officer (For Healthcare) Does Patient Have a Medical Advance Directive?: No Would patient like information  on creating a medical advance directive?: No - Patient declined    Additional Information 1:1 In Past 12 Months?: No CIRT Risk: No Elopement Risk: No  Does patient have medical clearance?: Yes     Disposition: Case was staffed with Sharon Pollack DNP who reccommended patient be re-evaluated in the a.m. Staff will attempt to contact guardian on 05/04/16 to gather additional history and collateral information.      Disposition Initial Assessment Completed for this Encounter: Yes Disposition of Patient: Other dispositions Other disposition(s): Other (Comment) (re-evaluate in the a.m.)  On Site Evaluation by:   Reviewed with Physician:    Alfredia Ferguson 05/03/2016 6:38 PM

## 2016-05-04 DIAGNOSIS — F4323 Adjustment disorder with mixed anxiety and depressed mood: Secondary | ICD-10-CM | POA: Diagnosis not present

## 2016-05-04 DIAGNOSIS — Z8489 Family history of other specified conditions: Secondary | ICD-10-CM | POA: Diagnosis not present

## 2016-05-04 DIAGNOSIS — Z79899 Other long term (current) drug therapy: Secondary | ICD-10-CM | POA: Diagnosis not present

## 2016-05-04 MED ORDER — IBUPROFEN 200 MG PO TABS: 600.0000 mg | ORAL_TABLET | Freq: Three times a day (TID) | ORAL | Status: DC | PRN

## 2016-05-04 MED ORDER — ACETAMINOPHEN 325 MG PO TABS: 650.0000 mg | ORAL_TABLET | ORAL | Status: DC | PRN

## 2016-05-04 MED ORDER — HYDROXYZINE HCL 25 MG PO TABS: 25.0000 mg | ORAL_TABLET | Freq: Four times a day (QID) | ORAL | 0 refills | Status: DC | PRN

## 2016-05-04 MED ORDER — ONDANSETRON HCL 4 MG PO TABS: 4.0000 mg | ORAL_TABLET | Freq: Three times a day (TID) | ORAL | Status: DC | PRN

## 2016-05-04 MED ORDER — NAPROXEN 500 MG PO TABS: 500.0000 mg | ORAL_TABLET | Freq: Two times a day (BID) | ORAL | Status: DC

## 2016-05-04 MED ORDER — ALUM & MAG HYDROXIDE-SIMETH 200-200-20 MG/5ML PO SUSP: 30.0000 mL | ORAL | Status: DC | PRN

## 2016-05-04 MED ORDER — HYDROXYZINE HCL 25 MG PO TABS
25.0000 mg | ORAL_TABLET | Freq: Four times a day (QID) | ORAL | Status: DC | PRN
  Administered 2016-05-04: 25 mg via ORAL
  Filled 2016-05-04: qty 1

## 2016-05-04 MED ORDER — SERTRALINE HCL 50 MG PO TABS: 50.0000 mg | ORAL_TABLET | Freq: Every day | ORAL | Status: DC

## 2016-05-04 NOTE — Discharge Instructions (Signed)
For your ongoing mental health needs, you are advised to follow up with [facility].  New and returning patients are seen at their walk-in clinic.  Walk-in hours are Monday - Friday from 8:00 am - 3:00 pm.  Walk-in patients are seen on a first come, first served basis.  Try to arrive as early as possible for he best chance of being seen the same day: ° °     [facility] °     [address] °     , [place] [address] °     [phone] °

## 2016-05-04 NOTE — Consult Note (Signed)
Sharon Psychiatry Consult   Reason for Consult:  Suicidal ideations Referring Physician:  EDP Patient Identification: [name] MRN:  [id number] Principal Diagnosis: Adjustment disorder with mixed anxiety and depressed mood Diagnosis:   Patient Active Problem List   Diagnosis Date Noted  . Adjustment disorder with mixed anxiety and depressed mood [F43.23] 05/04/2016    Priority: High    Total Time spent with patient: 45 minutes  Subjective:   [name] is a 31 y.o. female patient denies suicidal ideations, reports she got upset "when someone tries to take my son."  HPI:  31 yo female who presents to the ED with suicidal ideations because she felt someone was trying to take her 25 yo son.  Caveat:  IDD with a guardian.  Today, she is cooperative and pleasant.  Denies suicidal ideations and any suicide attempts, no hallucinations or alcohol/drug abuse.  No homicidal ideations.  Her aunt is her guardian.  Stable for discharge.  Past Psychiatric History: depression  Risk to Self: None Risk to Others: Homicidal Ideation: No Thoughts of Harm to Others: No Current Homicidal Intent: No Current Homicidal Plan: No Access to Homicidal Means: No Identified Victim: na History of harm to others?: No Assessment of Violence: None Noted Violent Behavior Description: na Does patient have access to weapons?: No Criminal Charges Pending?: No Does patient have a court date: No Prior Inpatient Therapy: Prior Inpatient Therapy: No Prior Therapy Dates: na Prior Therapy Facilty/Provider(s): na Reason for Treatment: na Prior Outpatient Therapy: Prior Outpatient Therapy: No Prior Therapy Dates: na Prior Therapy Facilty/Provider(s): na Reason for Treatment: na Does patient have an ACCT team?: Unknown Does patient have Intensive In-House Services?  : Unknown Does patient have [facility] services? : Yes Does patient have P4CC services?: No  Past Medical History: History  reviewed. No pertinent past medical history. No past surgical history on file. Family History:  Family History  Problem Relation Age of Onset  . Coronary artery disease Father   . Coronary artery disease Other    Family Psychiatric  History: unknown Social History:  History  Alcohol Use No     History  Drug Use No    Social History   Social History  . Marital status: Single    Spouse name: N/A  . Number of children: N/A  . Years of education: N/A   Social History Main Topics  . Smoking status: Never Smoker  . Smokeless tobacco: None  . Alcohol use No  . Drug use: No  . Sexual activity: Not Asked   Other Topics Concern  . None   Social History Narrative  . None   Additional Social History:    Allergies:  No Known Allergies  Labs:  Results for orders placed or performed during the hospital encounter of 05/03/16 (from the past 48 hour(s))  Rapid urine drug screen (hospital performed)     Status: None   Collection Time: 05/03/16 11:12 AM  Result Value Ref Range   Opiates NONE DETECTED NONE DETECTED   Cocaine NONE DETECTED NONE DETECTED   Benzodiazepines NONE DETECTED NONE DETECTED   Amphetamines NONE DETECTED NONE DETECTED   Tetrahydrocannabinol NONE DETECTED NONE DETECTED   Barbiturates NONE DETECTED NONE DETECTED    Comment:        DRUG SCREEN FOR MEDICAL PURPOSES ONLY.  IF CONFIRMATION IS NEEDED FOR ANY PURPOSE, NOTIFY LAB WITHIN 5 DAYS.        LOWEST DETECTABLE LIMITS FOR URINE DRUG SCREEN Drug Class  Cutoff (ng/mL) Amphetamine      1000 Barbiturate      200 Benzodiazepine   034 Tricyclics       742 Opiates          300 Cocaine          300 THC              50   Comprehensive metabolic panel     Status: None   Collection Time: 05/03/16  1:55 PM  Result Value Ref Range   Sodium 140 135 - 145 mmol/L   Potassium 3.8 3.5 - 5.1 mmol/L   Chloride 105 101 - 111 mmol/L   CO2 29 22 - 32 mmol/L   Glucose, Bld 72 65 - 99 mg/dL   BUN 10 6 - 20  mg/dL   Creatinine, Ser 0.76 0.44 - 1.00 mg/dL   Calcium 8.9 8.9 - 10.3 mg/dL   Total Protein 7.8 6.5 - 8.1 g/dL   Albumin 4.2 3.5 - 5.0 g/dL   AST 27 15 - 41 U/L   ALT 25 14 - 54 U/L   Alkaline Phosphatase 58 38 - 126 U/L   Total Bilirubin 0.5 0.3 - 1.2 mg/dL   GFR calc non Af Amer >60 >60 mL/min   GFR calc Af Amer >60 >60 mL/min    Comment: (NOTE) The eGFR has been calculated using the CKD EPI equation. This calculation has not been validated in all clinical situations. eGFR's persistently <60 mL/min signify possible Chronic Kidney Disease.    Anion gap 6 5 - 15  Ethanol     Status: None   Collection Time: 05/03/16  1:55 PM  Result Value Ref Range   Alcohol, Ethyl (B) <5 <5 mg/dL    Comment:        LOWEST DETECTABLE LIMIT FOR SERUM ALCOHOL IS 5 mg/dL FOR MEDICAL PURPOSES ONLY   Salicylate level     Status: None   Collection Time: 05/03/16  1:55 PM  Result Value Ref Range   Salicylate Lvl <5.9 2.8 - 30.0 mg/dL  Acetaminophen level     Status: Abnormal   Collection Time: 05/03/16  1:55 PM  Result Value Ref Range   Acetaminophen (Tylenol), Serum <10 (L) 10 - 30 ug/mL    Comment:        THERAPEUTIC CONCENTRATIONS VARY SIGNIFICANTLY. A RANGE OF 10-30 ug/mL MAY BE AN EFFECTIVE CONCENTRATION FOR MANY PATIENTS. HOWEVER, SOME ARE BEST TREATED AT CONCENTRATIONS OUTSIDE THIS RANGE. ACETAMINOPHEN CONCENTRATIONS >150 ug/mL AT 4 HOURS AFTER INGESTION AND >50 ug/mL AT 12 HOURS AFTER INGESTION ARE OFTEN ASSOCIATED WITH TOXIC REACTIONS.   cbc     Status: None   Collection Time: 05/03/16  1:55 PM  Result Value Ref Range   WBC 4.7 4.0 - 10.5 K/uL   RBC 4.40 3.87 - 5.11 MIL/uL   Hemoglobin 13.4 12.0 - 15.0 g/dL   HCT 39.1 36.0 - 46.0 %   MCV 88.9 78.0 - 100.0 fL   MCH 30.5 26.0 - 34.0 pg   MCHC 34.3 30.0 - 36.0 g/dL   RDW 13.0 11.5 - 15.5 %   Platelets 254 150 - 400 K/uL    Current Facility-Administered Medications  Medication Dose Route Frequency Provider Last Rate  Last Dose  . acetaminophen (TYLENOL) tablet 650 mg  650 mg Oral Q4H PRN Virgel Manifold, MD      . alum & mag hydroxide-simeth (MAALOX/MYLANTA) 200-200-20 MG/5ML suspension 30 mL  30 mL Oral PRN Virgel Manifold, MD      .  hydrOXYzine (ATARAX/VISTARIL) tablet 25 mg  25 mg Oral Q6H PRN Patrecia Pour, NP   25 mg at 05/04/16 1227  . ibuprofen (ADVIL,MOTRIN) tablet 600 mg  600 mg Oral Q8H PRN Virgel Manifold, MD      . ondansetron [facility]) tablet 4 mg  4 mg Oral Q8H PRN Virgel Manifold, MD      . sertraline (ZOLOFT) tablet 50 mg  50 mg Oral Daily Patrecia Pour, NP       Current Outpatient Prescriptions  Medication Sig Dispense Refill  . meloxicam (MOBIC) 15 MG tablet Take 15 mg by mouth daily as needed for pain.    Marland Kitchen sertraline (ZOLOFT) 50 MG tablet Take 50 mg by mouth daily.      Musculoskeletal: Strength & Muscle Tone: within normal limits Gait & Station: normal Patient leans: N/A  Psychiatric Specialty Exam: Physical Exam  Constitutional: She is oriented to person, place, and time. She appears well-developed and well-nourished.  HENT:  Head: Normocephalic.  Neck: Normal range of motion.  Respiratory: Effort normal.  Musculoskeletal: Normal range of motion.  Neurological: She is alert and oriented to person, place, and time.  Psychiatric: Her speech is normal and behavior is normal. Judgment and thought content normal. Her mood appears anxious. Cognition and memory are normal.    Review of Systems  Psychiatric/Behavioral: The patient is nervous/anxious.   All other systems reviewed and are negative.   Blood pressure 112/72, pulse 72, temperature 97.9 F (36.6 C), temperature source Oral, resp. rate 16, last menstrual period 04/30/2016, SpO2 98 %.There is no height or weight on file to calculate BMI.  General Appearance: Casual  Eye Contact:  Good  Speech:  impediment  Volume:  Normal  Mood:  Anxious, mild  Affect:  Congruent  Thought Process:  Coherent and Descriptions of  Associations: Intact  Orientation:  Full (Time, Place, and Person)  Thought Content:  WDL  Suicidal Thoughts:  No  Homicidal Thoughts:  No  Memory:  Immediate;   Good Recent;   Good Remote;   Good  Judgement:  Fair  Insight:  Fair  Psychomotor Activity:  Normal  Concentration:  Concentration: Good and Attention Span: Good  Recall:  Good  Fund of Knowledge:  Fair  Language:  Good  Akathisia:  No  Handed:  Right  AIMS (if indicated):     Assets:  Housing Leisure Time Physical Health Resilience Social Support  ADL's:  Intact  Cognition:  Impaired,  Mild  Sleep:        Treatment Plan Summary: Daily contact with patient to assess and evaluate symptoms and progress in treatment, Medication management and Plan adjustment disorder with mixed disturbance of anxiety and depression:  -Crisis stabilization -Medication management:  Restarted Zoloft 50 mg daily for depression and started Vistaril 25 mg every six hours PRN anxiety -Individual counseling  Disposition: No evidence of imminent risk to self or others at present.    Waylan Boga, NP 05/04/2016 12:54 PM  Patient seen face-to-face for psychiatric evaluation, chart reviewed and case discussed with the physician extender and developed treatment plan. Reviewed the information documented and agree with the treatment plan. Corena Pilgrim, MD

## 2016-05-04 NOTE — Progress Notes (Signed)
CSW attempted to contact patient's aunt Arvin Collard(Rebecca Tolvert [phone]), no answer. CSW left voice message requesting return phone call.

## 2016-05-04 NOTE — Progress Notes (Signed)
CSW spoke with patient at bedside. CSW inquired about contact information for patient's guardian (Aunt [name]), patient reported that CSW could contact the patient's guardian's friend Sharon AlaOllie at [phone]). CSW contacted patient's guardian's friend [name]([name] [phone]), patient's guardian's friend reported that the patient's guardian was with her and provided patient's guardian with phone. CSW informed patient's guardian that patient was discharged and ready to be picked up. CSW inquired about patient's outpatient treatment hx, patient's guardian reported that she is unsure off the top of her head. CSW requested that patient's guardian bring guardianship paperwork when she comes to pick up patient to make a copy for patient's records, patient's guardian agreed.   CSW informed patient's RN that contact was made with patient's guardian and that she reported she was coming to pick patient up. CSW placed handout about [facility] in patient's chart, patient asleep and would not respond to CSW.

## 2016-05-04 NOTE — [facility] Assessment (Signed)
[facility] Assessment Progress Note  Per Thedore MinsMojeed Akintayo, MD, this pt does not require psychiatric hospitalization at this time.  [facility]  This has been included in pt's discharge instructions.  Pt's nurse has been notified.  Doylene Canninghomas Aly Hauser, MA Triage Specialist [phone]

## 2016-05-04 NOTE — [facility] Suicide Risk Assessment (Signed)
Suicide Risk Assessment  Dis[facility] Assessment   [facility]   Principal Problem: Adjustment disorder with mixed anxiety and depressed mood Discharge Diagnoses:  Patient Active Problem List   Diagnosis Date Noted  . Adjustment disorder with mixed anxiety and depressed mood [F43.23] 05/04/2016    Priority: High    Total Time spent with patient: 45 minutes  Musculoskeletal: Strength & Muscle Tone: within normal limits Gait & Station: normal Patient leans: N/A  Psychiatric Specialty Exam: Physical Exam  Constitutional: She is oriented to person, place, and time. She appears well-developed and well-nourished.  HENT:  Head: Normocephalic.  Neck: Normal range of motion.  Respiratory: Effort normal.  Musculoskeletal: Normal range of motion.  Neurological: She is alert and oriented to person, place, and time.  Psychiatric: Her speech is normal and behavior is normal. Judgment and thought content normal. Her mood appears anxious. Cognition and memory are normal.    Review of Systems  Psychiatric/Behavioral: The patient is nervous/anxious.   All other systems reviewed and are negative.   Blood pressure 112/72, pulse 72, temperature 97.9 F (36.6 C), temperature source Oral, resp. rate 16, last menstrual period 04/30/2016, SpO2 98 %.There is no height or weight on file to calculate BMI.  General Appearance: Casual  Eye Contact:  Good  Speech:  impediment  Volume:  Normal  Mood:  Anxious, mild  Affect:  Congruent  Thought Process:  Coherent and Descriptions of Associations: Intact  Orientation:  Full (Time, Place, and Person)  Thought Content:  WDL  Suicidal Thoughts:  No  Homicidal Thoughts:  No  Memory:  Immediate;   Good Recent;   Good Remote;   Good  Judgement:  Fair  Insight:  Fair  Psychomotor Activity:  Normal  Concentration:  Concentration: Good and Attention Span: Good  Recall:  Good  Fund of Knowledge:  Fair  Language:  Good  Akathisia:   No  Handed:  Right  AIMS (if indicated):     Assets:  Housing Leisure Time Physical Health Resilience Social Support  ADL's:  Intact  Cognition:  Impaired,  Mild  Sleep:      Mental Status Per Nursing Assessment::   On Admission:   suicidal ideations  Demographic Factors:  NA  Loss Factors: NA  Historical Factors: NA  Risk Reduction Factors:   Responsible for children under 33 years of age, Sense of responsibility to family, Living with another person, especially a relative and Positive social support  Continued Clinical Symptoms:  Anxiety, mild  Cognitive Features That Contribute To Risk:  None    Suicide Risk:  Minimal: No identifiable suicidal ideation.  Patients presenting with no risk factors but with morbid ruminations; may be classified as minimal risk based on the severity of the depressive symptoms    Plan Of Care/Follow-up recommendations:  Activity:  as tolerated Diet:  heart healthy diet  Loralie Malta, NP 05/04/2016, 1:02 PM

## 2017-06-23 ENCOUNTER — Ambulatory Visit: Payer: Self-pay | Admitting: Family Medicine

## 2017-09-20 ENCOUNTER — Ambulatory Visit (INDEPENDENT_AMBULATORY_CARE_PROVIDER_SITE_OTHER): Payer: Medicare Other

## 2017-09-20 ENCOUNTER — Other Ambulatory Visit: Payer: Self-pay

## 2017-09-20 ENCOUNTER — Ambulatory Visit (HOSPITAL_COMMUNITY)
Admission: EM | Admit: 2017-09-20 | Discharge: 2017-09-20 | Disposition: A | Discharge status: Home / Self Care | Payer: Medicare Other | Attending: Urgent Care | Admitting: Urgent Care

## 2017-09-20 ENCOUNTER — Encounter (HOSPITAL_COMMUNITY): Payer: Self-pay | Admitting: Emergency Medicine

## 2017-09-20 DIAGNOSIS — R2232 Localized swelling, mass and lump, left upper limb: Secondary | ICD-10-CM

## 2017-09-20 DIAGNOSIS — M7989 Other specified soft tissue disorders: Secondary | ICD-10-CM

## 2017-09-20 DIAGNOSIS — M79642 Pain in left hand: Secondary | ICD-10-CM

## 2017-09-20 MED ORDER — KETOROLAC TROMETHAMINE 60 MG/2ML IM SOLN
60.0000 mg | Freq: Once | INTRAMUSCULAR | Status: AC
Start: 2017-09-20 — End: 2017-09-20
  Administered 2017-09-20: 60 mg via INTRAMUSCULAR

## 2017-09-20 MED ORDER — KETOROLAC TROMETHAMINE 60 MG/2ML IM SOLN
INTRAMUSCULAR | Status: AC
  Filled 2017-09-20: qty 2

## 2017-09-20 MED ORDER — NAPROXEN 500 MG PO TABS: 500.0000 mg | ORAL_TABLET | Freq: Two times a day (BID) | ORAL | 0 refills | Status: AC

## 2017-09-20 NOTE — ED Provider Notes (Signed)
  MRN: [id number] DOB: [birth year]  Subjective:   [name] is a 32 y.o. female presenting for 3-day history of left hand swelling, pain, decreased range of motion without known trauma.  Patient denies fever, redness.  Patient's mother has tried icing, Tylenol with minimal relief.  Denies any chronic medical conditions.  She is not currently taking any chronic medications.  Denies any known food or drug allergies.  Denies past medical history, past surgical history.  Objective:   Vitals: BP (!) 159/111 (BP Location: Right Arm)   Pulse 68   Temp 98.8 F (37.1 C) (Oral)   Resp 18   SpO2 98%   BP Readings from Last 3 Encounters:  09/20/17 (!) 159/111  05/04/16 112/72  11/25/11 128/82    Physical Exam  Constitutional: She is oriented to person, place, and time. She appears well-developed and well-nourished.  Cardiovascular: Normal rate.  Pulmonary/Chest: Effort normal.  Musculoskeletal:       Left hand: She exhibits decreased range of motion (secondary to pain), tenderness and swelling. She exhibits normal capillary refill, no deformity and no laceration. Normal sensation noted. Normal strength noted.  Patient endorses exquisite tenderness with movement of her fingers and hand.  There is swelling over the dorsal aspect of hand.  No erythema or ecchymosis, bony deformity.  Patient can only tolerate very superficial palpation of her hand.  Neurological: She is alert and oriented to person, place, and time.   Dg Hand Complete Left  Result Date: 09/20/2017 CLINICAL DATA:  Left hand pain and swelling.  No injury or trauma. EXAM: LEFT HAND - COMPLETE 3+ VIEW COMPARISON:  None. FINDINGS: The joint spaces are maintained. No acute bony findings or arthropathic changes. 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 IMPRESSION: No acute bony findings. Electronically Signed   By: Rudie MeyerP.  Gallerani M.D.   On: 09/20/2017 19:06   Assessment and Plan :   Left hand  pain  Swelling of left hand  Unclear etiology.  Counseled patient that her x-ray findings are reassuring.  Suspect inflammatory process versus infectious process.  Given that patient has has not used NSAID, will start patient on naproxen.  Return to clinic precautions discussed.  Patient can continue to schedule Tylenol.   Wallis BambergMani, Cortni Tays, [place] 09/20/17 1931

## 2017-09-20 NOTE — Discharge Instructions (Signed)
You may take 500mg  Tylenol every 6 hours for pain and inflammation. Please also use naproxen twice daily with food. Do not wait more than 2 days to come back if you continue to have symptoms or develop redness, fever, worsening swelling.

## 2017-09-20 NOTE — ED Triage Notes (Signed)
The patient presented to the [facility] with a complaint of left hand pain and swelling x 3 days. The patient denied any known injury. 

## 2019-06-29 IMAGING — DX DG HAND COMPLETE 3+V*L*
3 series · 3 of 3 positions shown · non-contrast
Comparison: None.

CLINICAL DATA: Left hand pain and swelling.  No injury or trauma.

EXAM:
LEFT HAND - COMPLETE 3+ VIEW

[hand pa]
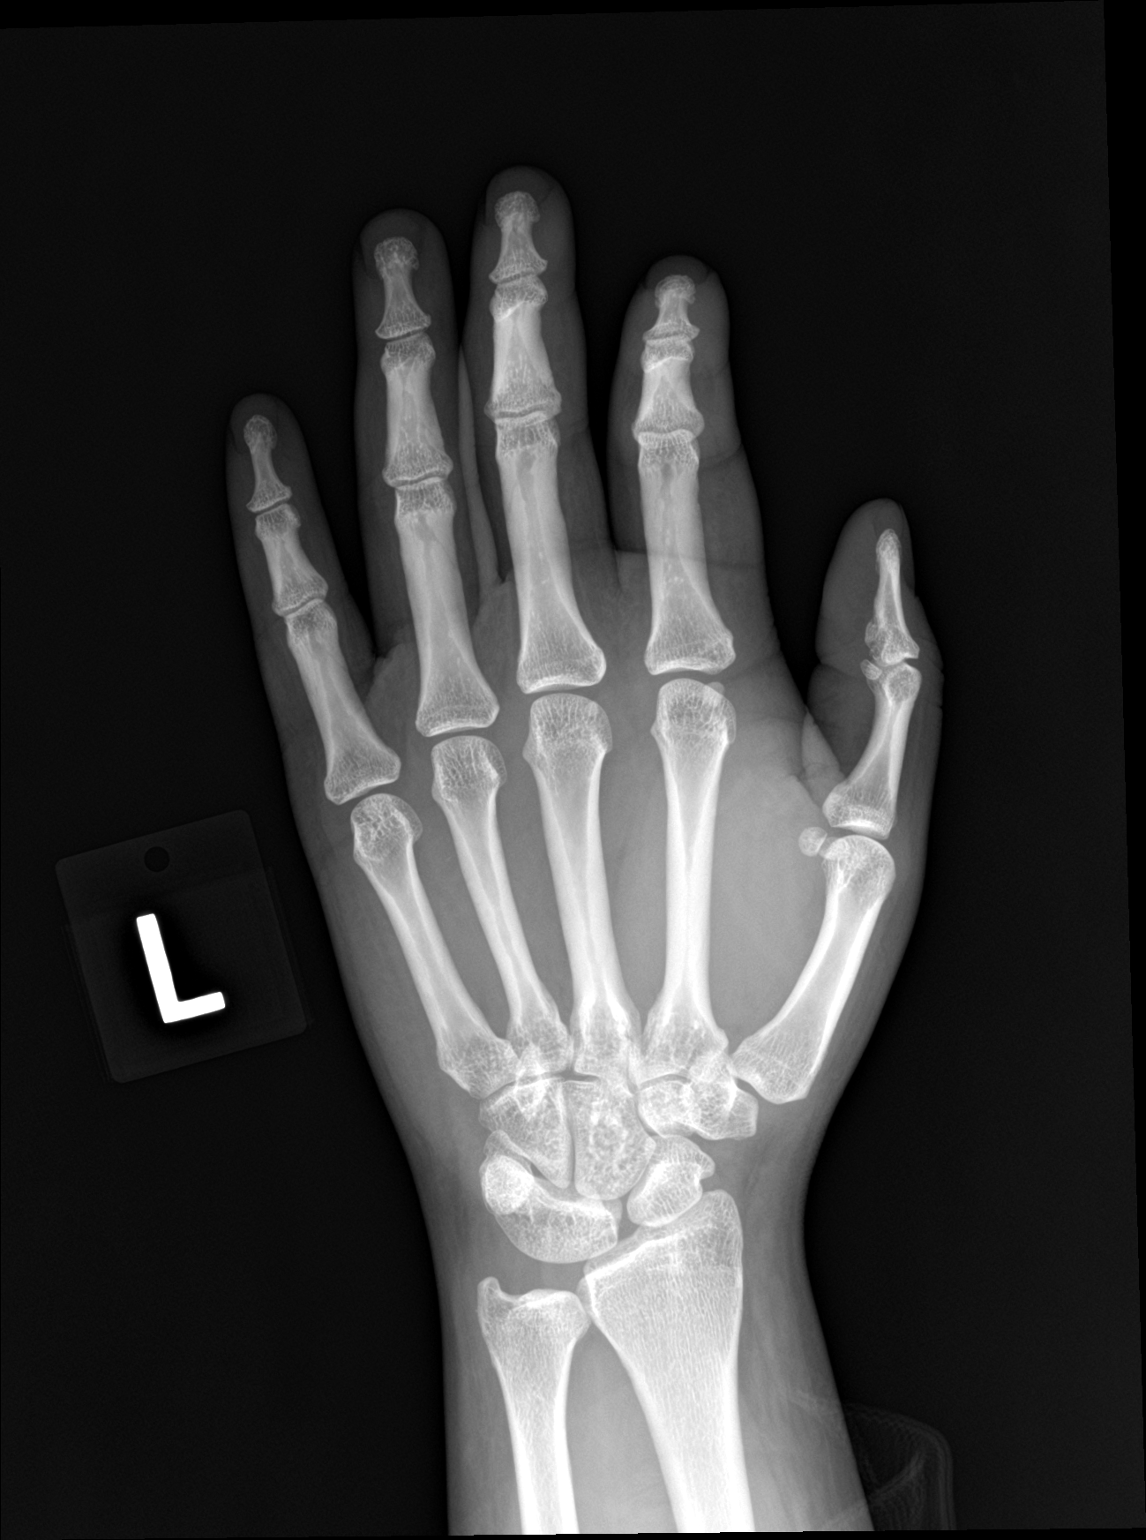

[hand obl]
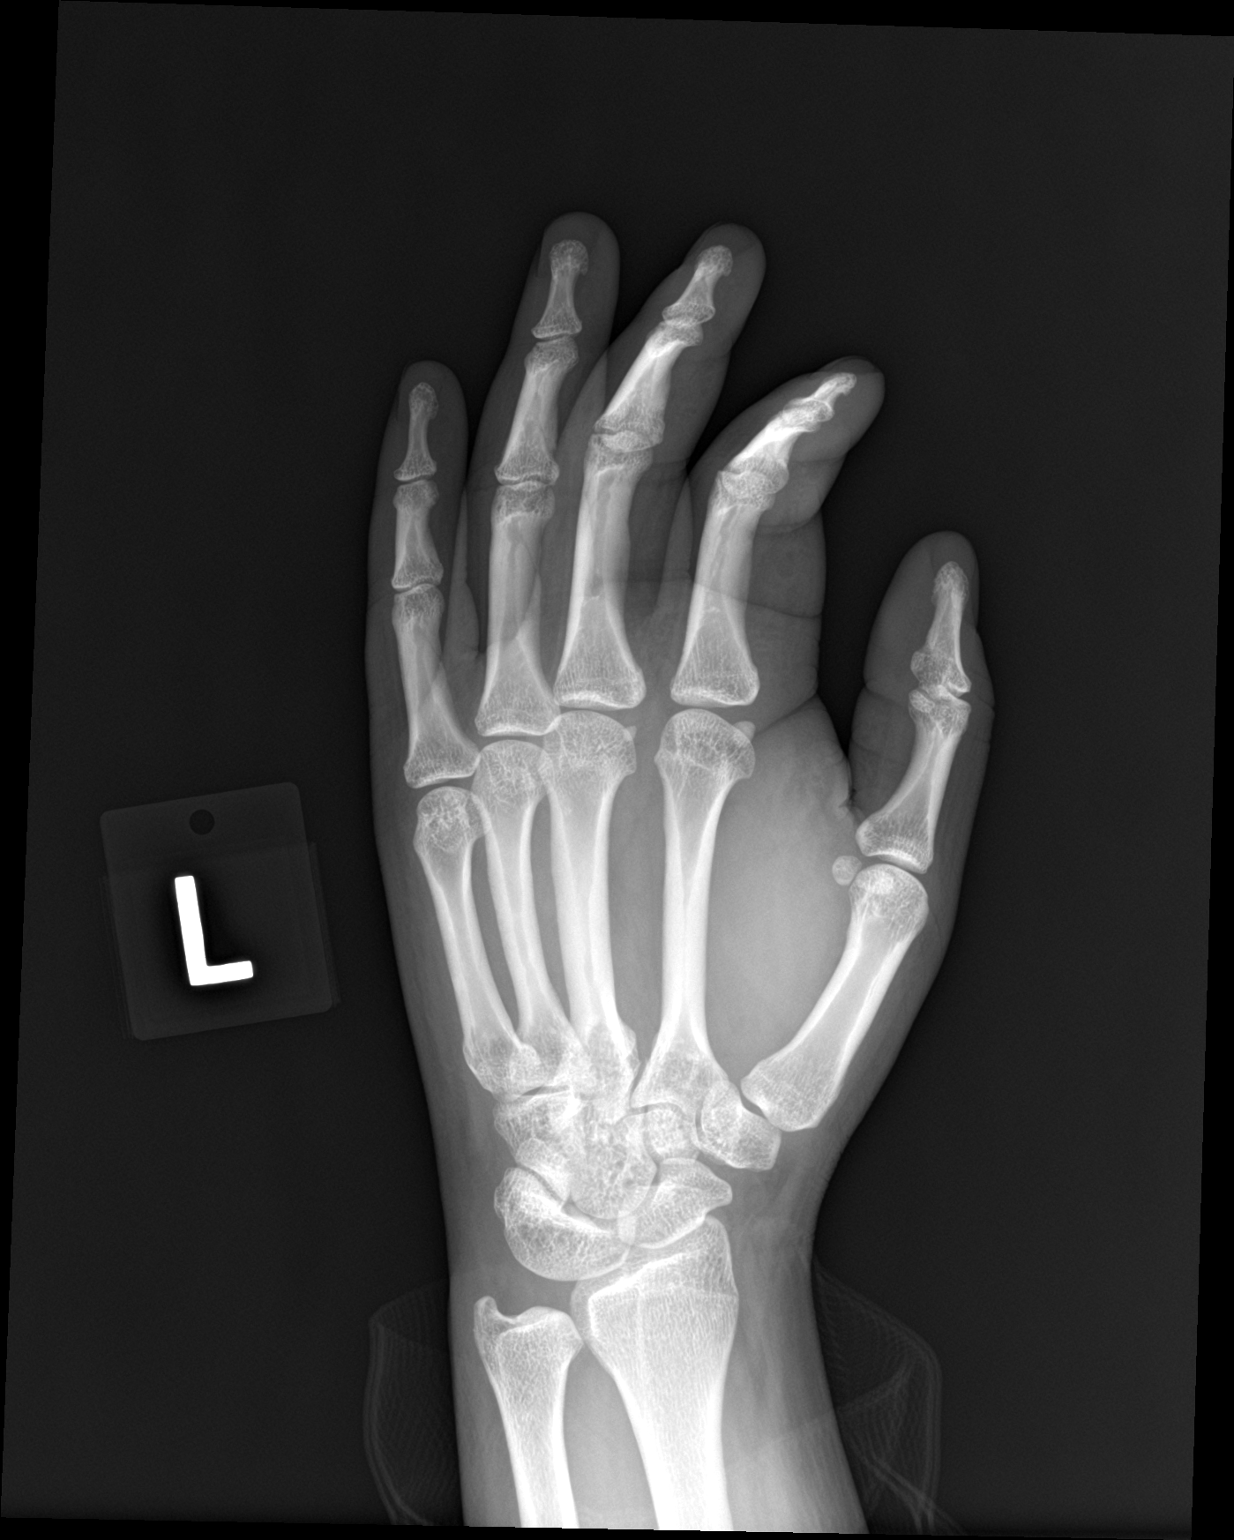

[hand lat]
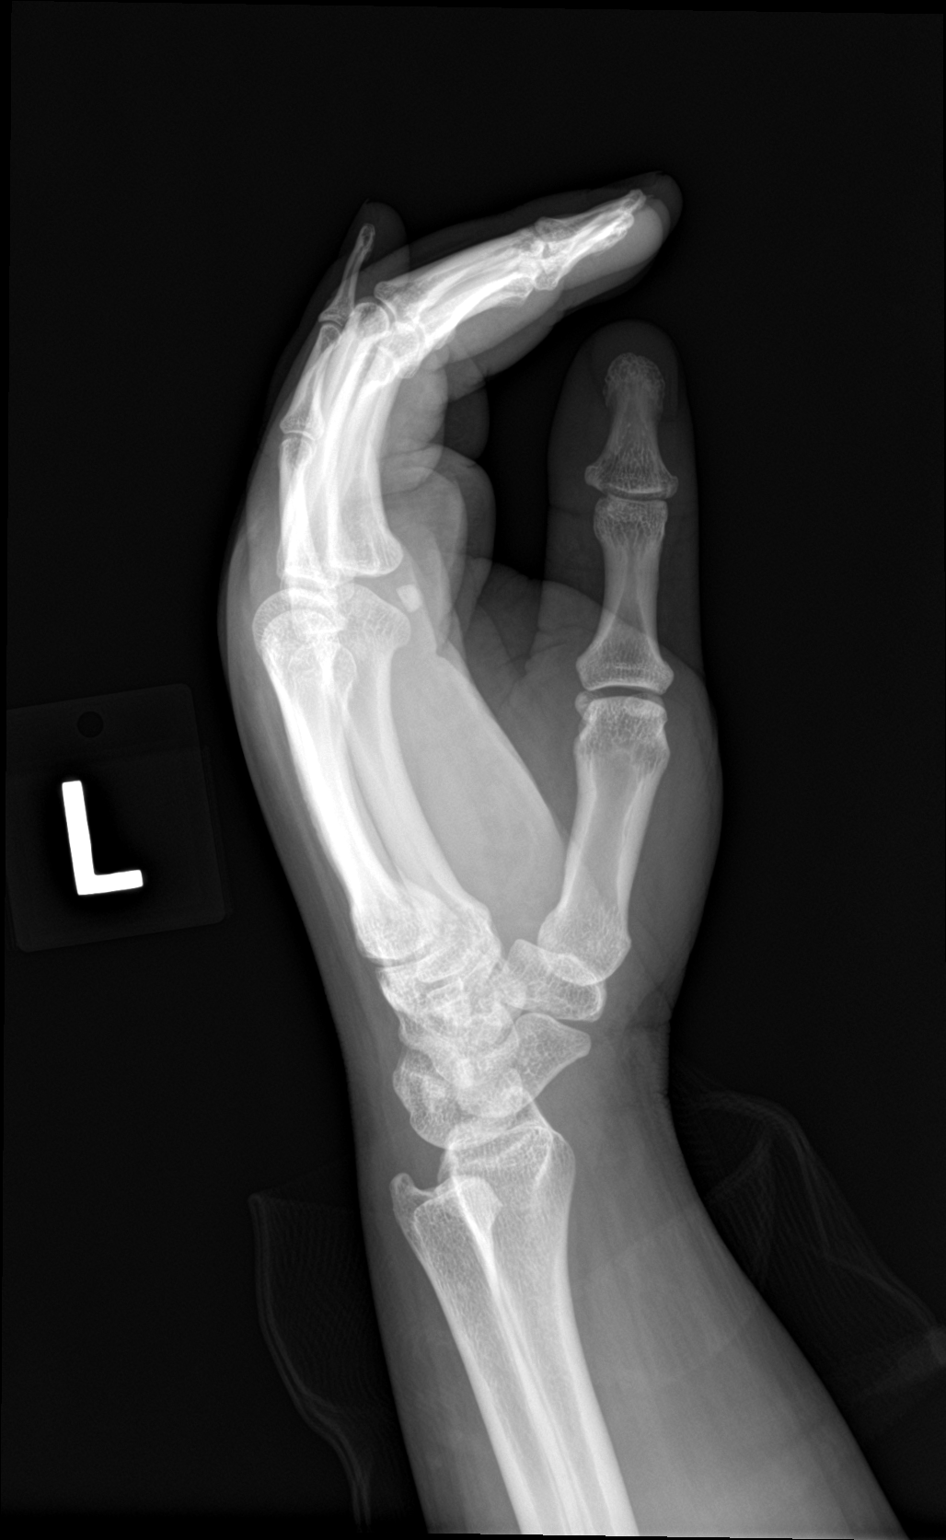

[3 of 3 positions shown; findings below may reference images not displayed]

FINDINGS: The joint spaces are maintained. No acute bony findings or
arthropathic changes. Incidental congenital anomaly at the wrist
with lunotriquetral coalition.
IMPRESSION: No acute bony findings.

## 2019-12-14 ENCOUNTER — Other Ambulatory Visit: Payer: Self-pay

## 2019-12-14 ENCOUNTER — Other Ambulatory Visit: Payer: Medicare Other

## 2019-12-14 DIAGNOSIS — Z20822 Contact with and (suspected) exposure to covid-19: Secondary | ICD-10-CM

## 2019-12-15 LAB — NOVEL CORONAVIRUS, NAA: SARS-CoV-2, NAA: NOT DETECTED

## 2019-12-18 ENCOUNTER — Telehealth: Payer: Self-pay

## 2019-12-18 NOTE — Telephone Encounter (Signed)
Called and informed patient that test for Covid 19 was NEGATIVE. Discussed signs and symptoms of Covid 19 : fever, chills, respiratory symptoms, cough, ENT symptoms, sore throat, SOB, muscle pain, diarrhea, headache, loss of taste/smell, close exposure to COVID-19 patient. Pt instructed to call PCP if they develop the above signs and sx. Pt also instructed to call 911 if having respiratory issues/distress. Discussed MyChart enrollment. Pt verbalized understanding.  

## 2020-01-30 ENCOUNTER — Other Ambulatory Visit: Payer: Self-pay

## 2020-01-30 DIAGNOSIS — Z20822 Contact with and (suspected) exposure to covid-19: Secondary | ICD-10-CM

## 2020-01-31 LAB — SARS-COV-2, NAA 2 DAY TAT

## 2020-01-31 LAB — NOVEL CORONAVIRUS, NAA: SARS-CoV-2, NAA: NOT DETECTED

## 2020-06-19 ENCOUNTER — Other Ambulatory Visit: Payer: Self-pay | Admitting: Internal Medicine

## 2020-06-20 LAB — PAP IG W/ RFLX HPV ASCU

## 2020-06-20 LAB — COMPLETE METABOLIC PANEL WITH GFR
AG Ratio: 1.6 (calc) (ref 1.0–2.5)
ALT: 11 U/L (ref 6–29)
AST: 19 U/L (ref 10–30)
Albumin: 4.6 g/dL (ref 3.6–5.1)
Alkaline phosphatase (APISO): 55 U/L (ref 31–125)
BUN: 9 mg/dL (ref 7–25)
CO2: 23 mmol/L (ref 20–32)
Calcium: 9.7 mg/dL (ref 8.6–10.2)
Chloride: 101 mmol/L (ref 98–110)
Creat: 0.83 mg/dL (ref 0.50–1.10)
GFR, Est African American: 104 mL/min/{1.73_m2} (ref >=60)
GFR, Est Non African American: 90 mL/min/{1.73_m2} (ref >=60)
Globulin: 2.8 g/dL (calc) (ref 1.9–3.7)
Glucose, Bld: 89 mg/dL (ref 65–99)
Potassium: 4.1 mmol/L (ref 3.5–5.3)
Sodium: 140 mmol/L (ref 135–146)
Total Bilirubin: 0.8 mg/dL (ref 0.2–1.2)
Total Protein: 7.4 g/dL (ref 6.1–8.1)

## 2020-06-20 LAB — LIPID PANEL
Cholesterol: 224 mg/dL — ABNORMAL HIGH (ref <200)
HDL: 95 mg/dL (ref >=50)
LDL Cholesterol (Calc): 115 mg/dL (calc) — ABNORMAL HIGH
Non-HDL Cholesterol (Calc): 129 mg/dL (calc) (ref <130)
Total CHOL/HDL Ratio: 2.4 (calc) (ref <5.0)
Triglycerides: 54 mg/dL (ref <150)

## 2020-06-20 LAB — CBC
HCT: 43.1 % (ref 35.0–45.0)
Hemoglobin: 14.3 g/dL (ref 11.7–15.5)
MCH: 30.3 pg (ref 27.0–33.0)
MCHC: 33.2 g/dL (ref 32.0–36.0)
MCV: 91.3 fL (ref 80.0–100.0)
MPV: 10.4 fL (ref 7.5–12.5)
Platelets: 302 10*3/uL (ref 140–400)
RBC: 4.72 10*6/uL (ref 3.80–5.10)
RDW: 13 % (ref 11.0–15.0)
WBC: 3.7 10*3/uL — ABNORMAL LOW (ref 3.8–10.8)

## 2020-06-20 LAB — VITAMIN D 25 HYDROXY (VIT D DEFICIENCY, FRACTURES): Vit D, 25-Hydroxy: 18 ng/mL — ABNORMAL LOW (ref 30–100)

## 2020-06-20 LAB — C. TRACHOMATIS/N. GONORRHOEAE RNA
C. trachomatis RNA, TMA: NOT DETECTED
N. gonorrhoeae RNA, TMA: NOT DETECTED

## 2020-06-20 LAB — TSH: TSH: 0.78 mIU/L

## 2021-08-06 ENCOUNTER — Other Ambulatory Visit: Payer: Self-pay | Admitting: Internal Medicine

## 2021-08-07 LAB — T3 UPTAKE: T3 Uptake: 28 % (ref 22–35)

## 2021-08-07 LAB — T3: T3, Total: 92 ng/dL (ref 76–181)

## 2021-08-07 LAB — TSH: TSH: 0.85 mIU/L

## 2021-08-07 LAB — EXTRA LAV TOP TUBE

## 2021-08-07 LAB — T4, FREE: Free T4: 1 ng/dL (ref 0.8–1.8)

## 2023-07-20 ENCOUNTER — Other Ambulatory Visit: Payer: Self-pay | Admitting: Internal Medicine

## 2023-07-20 DIAGNOSIS — Z1231 Encounter for screening mammogram for malignant neoplasm of breast: Secondary | ICD-10-CM

## 2023-07-28 ENCOUNTER — Ambulatory Visit
Admission: RE | Admit: 2023-07-28 | Discharge: 2023-07-28 | Disposition: A | Discharge status: Home / Self Care | Source: Ambulatory Visit | Attending: Internal Medicine | Admitting: Internal Medicine

## 2023-07-28 DIAGNOSIS — Z1231 Encounter for screening mammogram for malignant neoplasm of breast: Secondary | ICD-10-CM

## 2023-09-14 NOTE — Progress Notes (Unsigned)
   ANNUAL EXAM Patient name: [name] MRN [id number]  Date of birth: 1985/12/01 Chief Complaint:   No chief complaint on file.  History of Present Illness:   [name] is a 38 y.o. No obstetric history on file. {race:25618} female being seen today for a routine annual exam.  Current complaints: ***  No LMP recorded.   The pregnancy intention screening data noted above was reviewed. Potential methods of contraception were discussed. The patient elected to proceed with No data recorded.   Last pap ***. Results were: {Pap findings:25134}. H/O abnormal pap: {yes/yes***/no:23866} Last mammogram: 07/28/23. Results were: normal. Family h/o breast cancer: {yes***/no:23838} Last colonoscopy: ***. Results were: {normal, abnormal, n/a:23837}. Family h/o colorectal cancer: {yes***/no:23838}      No data to display               No data to display           Review of Systems:   Pertinent items are noted in HPI Denies any headaches, blurred vision, fatigue, shortness of breath, chest pain, abdominal pain, abnormal vaginal discharge/itching/odor/irritation, problems with periods, bowel movements, urination, or intercourse unless otherwise stated above. Pertinent History Reviewed:  Reviewed past medical,surgical, social and family history.  Reviewed problem list, medications and allergies. Physical Assessment:  There were no vitals filed for this visit.There is no height or weight on file to calculate BMI.        Physical Examination:   General appearance - well appearing, and in no distress  Mental status - alert, oriented to person, place, and time  Psych:  She has a normal mood and affect  Skin - warm and dry, normal color, no suspicious lesions noted  Chest - effort normal, all lung fields clear to auscultation bilaterally  Heart - normal rate and regular rhythm  Neck:  midline trachea, no thyromegaly or nodules  Breasts - breasts appear normal, no suspicious  masses, no skin or nipple changes or  axillary nodes  Abdomen - soft, nontender, nondistended, no masses or organomegaly  Pelvic - VULVA: normal appearing vulva with no masses, tenderness or lesions  VAGINA: normal appearing vagina with normal color and discharge, no lesions  CERVIX: normal appearing cervix without discharge or lesions, no CMT  Thin prep pap is {Desc; done/not:10129} *** HR HPV cotesting  UTERUS: uterus is felt to be normal size, shape, consistency and nontender   ADNEXA: No adnexal masses or tenderness noted.  Rectal - normal rectal, good sphincter tone, no masses felt. Hemoccult: ***  Extremities:  No swelling or varicosities noted  Chaperone present for exam  No results found for this or any previous visit (from the past 24 hours).  Assessment & Plan:   Mammogram: {Mammo f/u:25212::"@ 40yo"}, or sooner if problems Colonoscopy: {TCS f/u:25213::"@ 40yo"}, or sooner if problems  No orders of the defined types were placed in this encounter.   Meds: No orders of the defined types were placed in this encounter.   Follow-up: No follow-ups on file.  Boone Gear E Tamura Lasky, [place] 09/14/2023 3:07 PM

## 2023-09-15 ENCOUNTER — Encounter: Payer: Self-pay | Admitting: Physician Assistant

## 2023-09-15 ENCOUNTER — Ambulatory Visit (INDEPENDENT_AMBULATORY_CARE_PROVIDER_SITE_OTHER): Admitting: Physician Assistant

## 2023-09-15 ENCOUNTER — Other Ambulatory Visit (HOSPITAL_COMMUNITY)
Admission: RE | Admit: 2023-09-15 | Discharge: 2023-09-15 | Disposition: A | Discharge status: Home / Self Care | Source: Ambulatory Visit | Attending: Physician Assistant | Admitting: Physician Assistant

## 2023-09-15 VITALS — BP 117/78 | HR 79 | Ht 65.0 in | Wt 137.6 lb

## 2023-09-15 DIAGNOSIS — Z01419 Encounter for gynecological examination (general) (routine) without abnormal findings: Secondary | ICD-10-CM | POA: Insufficient documentation

## 2023-09-15 DIAGNOSIS — Z124 Encounter for screening for malignant neoplasm of cervix: Secondary | ICD-10-CM | POA: Diagnosis present

## 2023-09-15 DIAGNOSIS — Z1331 Encounter for screening for depression: Secondary | ICD-10-CM | POA: Diagnosis not present

## 2023-09-15 DIAGNOSIS — Z1151 Encounter for screening for human papillomavirus (HPV): Secondary | ICD-10-CM | POA: Diagnosis not present

## 2023-09-15 DIAGNOSIS — Z113 Encounter for screening for infections with a predominantly sexual mode of transmission: Secondary | ICD-10-CM | POA: Diagnosis not present

## 2023-09-15 NOTE — Progress Notes (Signed)
 Pt presents for annual. Pt has no questions or concerns at this time.

## 2023-09-19 LAB — CERVICOVAGINAL ANCILLARY ONLY
Chlamydia: NEGATIVE
Comment: NEGATIVE
Comment: NEGATIVE
Comment: NORMAL
Neisseria Gonorrhea: NEGATIVE
Trichomonas: NEGATIVE

## 2023-09-19 LAB — CYTOLOGY - PAP
Comment: NEGATIVE
Diagnosis: NEGATIVE
High risk HPV: NEGATIVE

## 2023-09-22 ENCOUNTER — Other Ambulatory Visit: Payer: Self-pay

## 2023-09-22 ENCOUNTER — Ambulatory Visit: Payer: Self-pay | Admitting: Physician Assistant

## 2023-09-22 MED ORDER — METRONIDAZOLE 500 MG PO TABS: 500.0000 mg | ORAL_TABLET | Freq: Two times a day (BID) | ORAL | 0 refills | Status: AC
